# Patient Record
Sex: Male | Born: 1950 | Race: White | Hispanic: No | Marital: Married | Smoking: Never smoker
Health system: Southern US, Community
[De-identification: ages and names within clinical notes are randomized; demographics above are authoritative.]

## PROBLEM LIST (undated history)

## (undated) DIAGNOSIS — E669 Obesity, unspecified: Secondary | ICD-10-CM

## (undated) DIAGNOSIS — I119 Hypertensive heart disease without heart failure: Secondary | ICD-10-CM

## (undated) DIAGNOSIS — E118 Type 2 diabetes mellitus with unspecified complications: Secondary | ICD-10-CM

## (undated) DIAGNOSIS — E66811 Obesity, class 1: Secondary | ICD-10-CM

## (undated) DIAGNOSIS — M1990 Unspecified osteoarthritis, unspecified site: Secondary | ICD-10-CM

## (undated) DIAGNOSIS — E785 Hyperlipidemia, unspecified: Secondary | ICD-10-CM

## (undated) DIAGNOSIS — I4891 Unspecified atrial fibrillation: Secondary | ICD-10-CM

## (undated) DIAGNOSIS — I9789 Other postprocedural complications and disorders of the circulatory system, not elsewhere classified: Secondary | ICD-10-CM

## (undated) DIAGNOSIS — I2510 Atherosclerotic heart disease of native coronary artery without angina pectoris: Secondary | ICD-10-CM

## (undated) DIAGNOSIS — I77810 Thoracic aortic ectasia: Secondary | ICD-10-CM

## (undated) HISTORY — DX: Type 2 diabetes mellitus with unspecified complications: E11.8

## (undated) HISTORY — PX: NASAL SEPTUM SURGERY: SHX37

## (undated) HISTORY — DX: Unspecified atrial fibrillation: I48.91

## (undated) HISTORY — DX: Hypertensive heart disease without heart failure: I11.9

## (undated) HISTORY — PX: SCROTUM EXPLORATION: SHX2389

## (undated) HISTORY — DX: Other postprocedural complications and disorders of the circulatory system, not elsewhere classified: I97.89

## (undated) HISTORY — DX: Hyperlipidemia, unspecified: E78.5

---

## 2013-02-25 ENCOUNTER — Ambulatory Visit (INDEPENDENT_AMBULATORY_CARE_PROVIDER_SITE_OTHER): Payer: BC Managed Care – PPO | Admitting: Internal Medicine

## 2013-02-25 ENCOUNTER — Encounter: Payer: Self-pay | Admitting: Internal Medicine

## 2013-02-25 VITALS — BP 112/80 | HR 79 | Ht 75.0 in | Wt 266.7 lb

## 2013-02-25 DIAGNOSIS — E669 Obesity, unspecified: Secondary | ICD-10-CM

## 2013-02-25 DIAGNOSIS — I10 Essential (primary) hypertension: Secondary | ICD-10-CM

## 2013-02-25 DIAGNOSIS — E119 Type 2 diabetes mellitus without complications: Secondary | ICD-10-CM

## 2013-02-25 DIAGNOSIS — I25119 Atherosclerotic heart disease of native coronary artery with unspecified angina pectoris: Secondary | ICD-10-CM | POA: Insufficient documentation

## 2013-02-25 DIAGNOSIS — I2510 Atherosclerotic heart disease of native coronary artery without angina pectoris: Secondary | ICD-10-CM

## 2013-02-25 DIAGNOSIS — E785 Hyperlipidemia, unspecified: Secondary | ICD-10-CM | POA: Insufficient documentation

## 2013-02-25 NOTE — Progress Notes (Signed)
OFFICE NOTE  Chief Complaint:  Establish new cardiologist  Primary Care Physician: Jolene Provost, MD  HPI:  Frank Moreno. is a pleasant 63 year old male who was recently followed [facility]a [facility] [place]. 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  Mr. Frank Moreno previously worked for the [employer] and is now retired. 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  He does have a history of coronary artery disease and was found to have a 50% mid circumflex stenosis by cath and 1999. At that time he was on no medical therapy for coronary disease and subsequently was placed on a statin, beta blocker and ARB and aspirin. He since done well and had no coronary events over the past 16 years. 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 He reports his cholesterol as been well controlled and that his blood pressure is usually within normal limits. He is active but does not regularly exercise. He reports sleeping well at night denies any snoring, gasping or other obstructive sleep apnea symptoms.  He did have a nuclear stress test in 2013 which was negative for ischemia. His last lipid profile was in December 2013 which showed total cholesterol 126, triglycerides 239, HDL 33 and LDL 45.  EF was 61% by nuclear stress testing in June 2013, which was nonischemic.  PMHx:  Past Medical History  Diagnosis Date  . Diabetes mellitus without complication   . Hyperlipidemia   . Coronary artery disease   . Hypertension     Past Surgical History  Procedure Laterality Date  . Cardiac catheterization      FAMHx:  Family History  Problem Relation Age of Onset  . Heart disease Mother   . Heart disease Maternal Grandfather     SOCHx:   reports that he has never smoked. He has never used smokeless tobacco. He reports that he does not drink alcohol or use illicit drugs.  ALLERGIES:  Allergies  Allergen  Reactions  . Ceclor [Cefaclor] Rash  . Penicillins Rash    ROS: A comprehensive review of systems was negative.  HOME MEDS: Current Outpatient Prescriptions  Medication Sig Dispense Refill  . albuterol (PROVENTIL HFA;VENTOLIN HFA) 108 (90 BASE) MCG/ACT inhaler Inhale 1-2 puffs into the lungs every 6 (six) hours as needed for wheezing or shortness of breath.      . allopurinol (ZYLOPRIM) 300 MG tablet Take 300 mg by mouth daily.      Marland Kitchen aspirin 81 MG tablet Take 81 mg by mouth daily.      . Canagliflozin-Metformin HCl (INVOKAMET) 50-1000 MG TABS Take 1 tablet by mouth 2 (two) times daily.      . carvedilol (COREG) 3.125 MG tablet Take 3.125 mg by mouth 2 (two) times daily with a meal.      . hydrochlorothiazide (HYDRODIURIL) 25 MG tablet Take 25 mg by mouth daily.      Marland Kitchen HYDROcodone-acetaminophen (NORCO/VICODIN) 5-325 MG per tablet Take 1 tablet by mouth every 6 (six) hours as needed for moderate pain.      Marland Kitchen losartan (COZAAR) 50 MG tablet Take 50 mg by mouth daily.      . nitroGLYCERIN (NITROSTAT) 0.4 MG SL tablet Place 0.4 mg under the tongue every 5 (five) minutes as needed for chest pain.      . simvastatin (ZOCOR) 80 MG tablet Take 80 mg by mouth daily.      Marland Kitchen  testosterone (ANDROGEL) 50 MG/5GM GEL Place 10 g onto the skin daily.       No current facility-administered medications for this visit.    LABS/IMAGING: No results found for this or any previous visit (from the past 48 hour(s)). No results found.  VITALS: BP 112/80  Pulse 79  Ht 6\' 3"  (1.905 m)  Wt 266 lb 11.2 oz (120.974 kg)  BMI 33.34 kg/m2  EXAM: General appearance: alert and no distress Neck: no carotid bruit and no JVD Lungs: clear to auscultation bilaterally Heart: regular rate and rhythm, S1, S2 normal, no murmur, click, rub or gallop Abdomen: soft, non-tender; bowel sounds normal; no masses,  no organomegaly Extremities: extremities normal, atraumatic, no cyanosis or edema Pulses: 2+ and symmetric Skin:  Skin color, texture, turgor normal. No rashes or lesions Neurologic: Grossly normal Psych: Mood, affect normal  EKG: Sinus rhythm at 79  ASSESSMENT: 1. Mild to moderate, nonobstructive coronary disease by cath in 1999 2. Hypertension-controlled 3. Dyslipidemia 4. Diabetes type 2 5. Obesity  PLAN: 1.   [name] has done well without any cardiovascular events over the past 16 years. He seems to be adequately controlled with his current medicine regimen. His triglycerides were high by testing in 2013. For now I would continue his current medications and plan to recheck lipid NMR in 6 months prior to his return. He may ultimately need a fibrate.  I've again highly recommended exercise and increase physical activity which can help with weight loss and overall risk factor modification. No changes to his current medications.  Thank you for allowing me to participate in his care.  Chrystie NoseKenneth C. Mikylah Ackroyd, MD, [facility] Attending Cardiologist [facility]  Ashna Dorough C 02/25/2013, 1:24 PM

## 2013-02-25 NOTE — Patient Instructions (Signed)
Your physician recommends that you return for lab work in: 6 months, prior to your office visit.   Your physician wants you to follow-up in: 6 months with Dr. Rennis GoldenHilty. You will receive a reminder letter in the mail two months in advance. If you don't receive a letter, please call our office to schedule the follow-up appointment.

## 2013-03-06 ENCOUNTER — Encounter: Payer: Self-pay | Admitting: Internal Medicine

## 2013-06-27 DIAGNOSIS — I77810 Thoracic aortic ectasia: Secondary | ICD-10-CM | POA: Diagnosis present

## 2013-07-22 ENCOUNTER — Telehealth: Payer: Self-pay | Admitting: Internal Medicine

## 2013-07-22 NOTE — Telephone Encounter (Signed)
Please mail out a lab order to patient before his appt on 08/29/13 .Marland Kitchen. Thanks

## 2013-07-22 NOTE — Telephone Encounter (Signed)
NMR lab order printed from last OV and lab slip mailed to patient. Called wife to tell her about this and that the labs were to be fasting.

## 2013-08-10 HISTORY — PX: CARDIAC CATHETERIZATION: SHX172

## 2013-08-21 LAB — NMR LIPOPROFILE WITH LIPIDS
Cholesterol, Total: 114 mg/dL (ref <200)
HDL Particle Number: 29.1 umol/L — ABNORMAL LOW (ref >=30.5)
HDL Size: 8.1 nm — ABNORMAL LOW (ref >=9.2)
HDL-C: 32 mg/dL — ABNORMAL LOW (ref >=40)
LARGE VLDL-P: 9.5 nmol/L — AB (ref <=2.7)
LDL (calc): 45 mg/dL (ref <100)
LDL PARTICLE NUMBER: 1284 nmol/L — AB (ref <1000)
LDL SIZE: 19.7 nm — AB (ref >20.5)
LP-IR Score: 86 — ABNORMAL HIGH (ref <=45)
Large HDL-P: <1.3 umol/L — ABNORMAL LOW (ref >=4.8)
SMALL LDL PARTICLE NUMBER: 1127 nmol/L — AB (ref <=527)
Triglycerides: 183 mg/dL — ABNORMAL HIGH (ref <150)
VLDL SIZE: 56.3 nm — AB (ref <=46.6)

## 2013-08-29 ENCOUNTER — Ambulatory Visit (INDEPENDENT_AMBULATORY_CARE_PROVIDER_SITE_OTHER): Payer: BC Managed Care – PPO | Admitting: Internal Medicine

## 2013-08-29 ENCOUNTER — Encounter: Payer: Self-pay | Admitting: Internal Medicine

## 2013-08-29 VITALS — BP 102/70 | HR 70 | Ht 75.0 in | Wt 267.0 lb

## 2013-08-29 DIAGNOSIS — E119 Type 2 diabetes mellitus without complications: Secondary | ICD-10-CM

## 2013-08-29 DIAGNOSIS — D689 Coagulation defect, unspecified: Secondary | ICD-10-CM

## 2013-08-29 DIAGNOSIS — I200 Unstable angina: Secondary | ICD-10-CM

## 2013-08-29 DIAGNOSIS — I2510 Atherosclerotic heart disease of native coronary artery without angina pectoris: Secondary | ICD-10-CM

## 2013-08-29 DIAGNOSIS — E785 Hyperlipidemia, unspecified: Secondary | ICD-10-CM

## 2013-08-29 DIAGNOSIS — I10 Essential (primary) hypertension: Secondary | ICD-10-CM

## 2013-08-29 DIAGNOSIS — R5383 Other fatigue: Secondary | ICD-10-CM

## 2013-08-29 DIAGNOSIS — E669 Obesity, unspecified: Secondary | ICD-10-CM

## 2013-08-29 DIAGNOSIS — I25119 Atherosclerotic heart disease of native coronary artery with unspecified angina pectoris: Secondary | ICD-10-CM

## 2013-08-29 DIAGNOSIS — I209 Angina pectoris, unspecified: Secondary | ICD-10-CM

## 2013-08-29 DIAGNOSIS — Z01818 Encounter for other preprocedural examination: Secondary | ICD-10-CM

## 2013-08-29 DIAGNOSIS — R5381 Other malaise: Secondary | ICD-10-CM

## 2013-08-29 DIAGNOSIS — R079 Chest pain, unspecified: Secondary | ICD-10-CM

## 2013-08-29 NOTE — Patient Instructions (Addendum)
Your physician has [facility] that [facility]. Cardiac catheterization is used to diagnose and/or treat various heart conditions. Doctors may recommend this procedure for a number of different reasons. The most common reason is to evaluate chest pain. Chest pain can be a symptom of coronary artery disease (CAD), and cardiac catheterization can show whether plaque is narrowing or blocking your heart's arteries. This procedure is also used to evaluate the valves, as well as measure the blood flow and oxygen levels in different parts of your heart. For further information please visit [email or web address] Please follow instruction sheet, as given.  You will need to have blood work & a chest x-ray 3-5 days prior to this procedure.  [facility] [address] You do not need an appointment  ** please schedule this with Dr. Rennis GoldenHilty for the first week of September (radial access)  Your physician has recommended you make the following change in your medication: STOP LOSARTAN

## 2013-08-29 NOTE — Progress Notes (Signed)
Patient ID: Rodell PernaHenry Dann Jr., male   DOB: 1950/10/13, 63 y.o.   MRN: [id number]    OFFICE NOTE  Chief Complaint:  6 month follow-up  Primary Care Physician: Jolene ProvostHAIMES,DAVID M, MD  HPI:  Rodell PernaHenry Cooler Jr. is a pleasant 63 year old male who was recently followed by Dr. Luberta Robertsonrowell [facility] [place]. 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 [name] previously worked for the [employer] and is now retired. He is an avid Therapist, nutritionalhunter and fisherman as well as has a gun hobby. He does have a history of coronary artery disease and was found to have a 50% mid circumflex stenosis by cath and 1999. At that time he was on no medical therapy for coronary disease and subsequently was placed on a statin, beta blocker and ARB and aspirin.   He notes he was hospitalized in May after a tooth extraction at his regular dentist. Following this he became septic and was in the ICU on antibiotics. Was in the hospital for 4 days. Found AAA while in the hospital. Notes dyspnea, diaphoresis, and no energy since leaving hospital. This occurs intermittently. His last episode of this occurred Tuesday and had episode of sharp chest pain when pick up a watermelon. Pain is in the middle of his chest and occurs in the middle of his back. Like being stabbed with an ice pick and sometimes it is a tight feeling. Sometimes moves to arms and rarely in his back. Has had dyspnea walking to the mailbox, about 100 feet of walking. This occurs intermittently. Chest pain comes with moderate activity such as weed eating. Can also occur at rest. The only thing that relieves the pain is resting. Has previously had shortness of breath and chest pain though this has worsened since his hospitalization. Denies syncope and palpitations. No orthopnea or PND. He does note he has taken nitroglycerin in the past for his chest pain with some benefit. He notes he infrequently takes cialis and was not aware that he should not mix the  cialis and nitroglycerin.  Has seen CT surgery for AAA since discharge from the hospital. Told he would not have surgery due to the size being less than 5.5 cm. Notes no abdominal pain. Does note cramps in feet and legs at night.  Notes he was referred to cardiology to see Dr Leeann Mustenaldo. Had a stress test in [facility] that revealed no infarct or ischemia, EF of 64%, and normal wall motion. Doubled his coreg at that visit. Decreased his HCTZ to 12.5 mg.  PMHx:  Past Medical History  Diagnosis Date  . Diabetes mellitus without complication   . Hyperlipidemia   . Coronary artery disease   . Hypertension     Past Surgical History  Procedure Laterality Date  . Cardiac catheterization      FAMHx:  Family History  Problem Relation Age of Onset  . Heart disease Mother   . Heart disease Maternal Grandfather     SOCHx:   reports that he has never smoked. He has never used smokeless tobacco. He reports that he does not drink alcohol or use illicit drugs.  ALLERGIES:  Allergies  Allergen Reactions  . Ceclor [Cefaclor] Rash  . Penicillins Rash    ROS: A comprehensive review of systems was negative except for: Respiratory: positive for dyspnea on exertion Cardiovascular: positive for chest pain, chest pressure/discomfort, dyspnea, exertional chest pressure/discomfort and fatigue Cramps in feet.   HOME MEDS: Current Outpatient Prescriptions  Medication Sig Dispense  Refill  . acetaminophen-codeine (TYLENOL #4) 300-60 MG per tablet as needed.      Marland Kitchen albuterol (PROVENTIL HFA;VENTOLIN HFA) 108 (90 BASE) MCG/ACT inhaler Inhale 1-2 puffs into the lungs every 6 (six) hours as needed for wheezing or shortness of breath.      . allopurinol (ZYLOPRIM) 300 MG tablet Take 300 mg by mouth daily.      Marland Kitchen aspirin 81 MG tablet Take 81 mg by mouth daily.      . Canagliflozin-Metformin HCl (INVOKAMET) 50-1000 MG TABS Take 1 tablet by mouth 2 (two) times daily.      . carvedilol (COREG) 3.125 MG  tablet Take 3.125 mg by mouth 2 (two) times daily with a meal.      . Cholecalciferol (VITAMIN D-3) 1000 UNITS CAPS Take 4,000 Units by mouth daily.      . hydrochlorothiazide (HYDRODIURIL) 25 MG tablet Take 12.5 mg by mouth daily.       Marland Kitchen HYDROcodone-acetaminophen (NORCO/VICODIN) 5-325 MG per tablet Take 1 tablet by mouth every 6 (six) hours as needed for moderate pain.      Marland Kitchen losartan (COZAAR) 50 MG tablet Take 50 mg by mouth daily.      . nitroGLYCERIN (NITROSTAT) 0.4 MG SL tablet Place 0.4 mg under the tongue every 5 (five) minutes as needed for chest pain.      . piroxicam (FELDENE) 20 MG capsule Take 20 mg by mouth daily.      Marland Kitchen pyridoxine (B-6) 100 MG tablet Take 200 mg by mouth daily.      . sildenafil (VIAGRA) 50 MG tablet Take 50 mg by mouth as needed.      . simvastatin (ZOCOR) 80 MG tablet Take 80 mg by mouth daily.      Marland Kitchen testosterone (ANDROGEL) 50 MG/5GM GEL Place 10 g onto the skin daily.      Marland Kitchen tiZANidine (ZANAFLEX) 4 MG tablet Take 4 mg by mouth 2 (two) times daily as needed.      . vitamin C (ASCORBIC ACID) 250 MG tablet Take 500 mg by mouth daily.       No current facility-administered medications for this visit.    LABS/IMAGING: No results found for this or any previous visit (from the past 48 hour(s)). No results found.  VITALS: BP 102/70  Pulse 70  Ht 6\' 3"  (1.905 m)  Wt 267 lb (121.11 kg)  BMI 33.37 kg/m2  EXAM: General appearance: alert, cooperative and no distress Neck: no adenopathy, no carotid bruit, no JVD and supple, symmetrical, trachea midline Lungs: clear to auscultation bilaterally Heart: regular rate and rhythm, S1, S2 normal, no murmur, click, rub or gallop Abdomen: soft, non-tender; bowel sounds normal; no masses,  no organomegaly Extremities: extremities normal, atraumatic, no cyanosis or edema Pulses: 2+ and symmetric radial Skin: Skin color, texture, turgor normal. No rashes or lesions  EKG: NSR, rate 70, no ischemic  changes  ASSESSMENT: 1. Anginal chest pain - recent negative stress test 2. Mild to moderate, non-obstructive coronary disease by cath in 1999 3. Hypertension-controlled 4. Dyslipidemia 5. Type 2 Diabetes 6. Obesity 7. Abdominal aortic aneurysm  PLAN: 1.   Patient presents with typical anginal pain in spite of recent normal stress test. The persistence of symptoms brings up the possibility of a false negative stress test. We discussed this with the patient. We additionally discussed that his recent illness could have resulted in changes that pushed his previous cardiac disease to the point of causing chest pain. Given this continued  chest pain we will proceed with cardiac catheterization to further characterize his coronary arteries. His abdominal aortic aneurysm is at a size that should be monitored every 6-12 months for progression. At this time we will focus on risk factor management for this issue. His blood pressure is on the low end of normal and we will discontinue his losartan to see if this brings his blood pressure in to a more appropriate range. We discussed return precautions. He will be scheduled for catheterization the week after next.   Marikay Alar 08/29/2013, 11:31 AM  Pt. Seen and examined. Agree with the resident note as written.  Mr. Poblete has unfortunately been very sick recently. Seems to all started after he had tooth extraction and developed sepsis. This is quite unusual. Nevertheless after this episode is felt fatigued he's had recurrent episodes of chest discomfort and tightness when he performs exercises which is relieved by rest. When he exerts himself he has had episodes where he becomes nauseated and sweaty. He takes nitroglycerin and had relief in his symptoms. He was referred to a cardiologist with [facility] and underwent a nuclear stress test which was negative for ischemia. He had medication adjustments including increasing in of his beta blocker to help  apparently with this aneurysm that was discovered. He also saw thoracic surgeon who felt that he was not at this point necessarily in need of any surgical repair of his aneurysm. I would agree with that. He will need close followup with ultrasound and we will do that in this office. I am concerned about his ongoing anginal symptoms. They do sound fairly typical and he does have a history of coronary disease remotely which was at least moderate in 1999. He has not had catheterization or a relook since that time. I am concerned that his stress test may represent a false negative and he could have balanced ischemia. Based on his ongoing symptoms I would recommend cardiac catheterization. He is agreeable to this. We'll likely plan a radial approach due to his abdominal aortic aneurysm. Hopefully we can compasses within the next few weeks.  Chrystie Nose, MD, [facility] Attending Cardiologist [facility]

## 2013-08-30 ENCOUNTER — Encounter: Payer: Self-pay | Admitting: Internal Medicine

## 2013-08-30 DIAGNOSIS — R5383 Other fatigue: Secondary | ICD-10-CM | POA: Insufficient documentation

## 2013-08-30 DIAGNOSIS — I200 Unstable angina: Secondary | ICD-10-CM | POA: Insufficient documentation

## 2013-09-03 ENCOUNTER — Encounter (HOSPITAL_COMMUNITY): Payer: Self-pay

## 2013-09-05 ENCOUNTER — Ambulatory Visit
Admission: RE | Admit: 2013-09-05 | Discharge: 2013-09-05 | Disposition: A | Discharge status: Home / Self Care | Payer: BC Managed Care – PPO | Source: Ambulatory Visit | Attending: Internal Medicine | Admitting: Internal Medicine

## 2013-09-05 DIAGNOSIS — Z01818 Encounter for other preprocedural examination: Secondary | ICD-10-CM

## 2013-09-05 LAB — CBC
HCT: 51.9 % (ref 39.0–52.0)
Hemoglobin: 17.9 g/dL — ABNORMAL HIGH (ref 13.0–17.0)
MCH: 30 pg (ref 26.0–34.0)
MCHC: 34.5 g/dL (ref 30.0–36.0)
MCV: 86.9 fL (ref 78.0–100.0)
PLATELETS: 191 10*3/uL (ref 150–400)
RBC: 5.97 MIL/uL — ABNORMAL HIGH (ref 4.22–5.81)
RDW: 14.8 % (ref 11.5–15.5)
WBC: 7.1 10*3/uL (ref 4.0–10.5)

## 2013-09-05 LAB — BASIC METABOLIC PANEL
BUN: 20 mg/dL (ref 6–23)
CO2: 26 meq/L (ref 19–32)
Calcium: 9.6 mg/dL (ref 8.4–10.5)
Chloride: 102 mEq/L (ref 96–112)
Creat: 1.23 mg/dL (ref 0.50–1.35)
Glucose, Bld: 119 mg/dL — ABNORMAL HIGH (ref 70–99)
Potassium: 4.6 mEq/L (ref 3.5–5.3)
Sodium: 136 mEq/L (ref 135–145)

## 2013-09-05 LAB — TSH: TSH: 3.031 u[IU]/mL (ref 0.350–4.500)

## 2013-09-06 LAB — PROTIME-INR
INR: 0.93 (ref <1.50)
Prothrombin Time: 12.5 seconds (ref 11.6–15.2)

## 2013-09-06 LAB — APTT: aPTT: 33 seconds (ref 24–37)

## 2013-09-11 ENCOUNTER — Other Ambulatory Visit: Payer: Self-pay

## 2013-09-11 ENCOUNTER — Encounter (HOSPITAL_COMMUNITY)
Admission: RE | Disposition: A | Discharge status: Home / Self Care | Payer: Self-pay | Source: Ambulatory Visit | Attending: Thoracic Surgery (Cardiothoracic Vascular Surgery)

## 2013-09-11 ENCOUNTER — Inpatient Hospital Stay (HOSPITAL_COMMUNITY): Payer: BC Managed Care – PPO

## 2013-09-11 ENCOUNTER — Encounter (HOSPITAL_COMMUNITY): Payer: Self-pay | Admitting: Thoracic Surgery (Cardiothoracic Vascular Surgery)

## 2013-09-11 ENCOUNTER — Inpatient Hospital Stay (HOSPITAL_COMMUNITY)
Admission: RE | Admit: 2013-09-11 | Discharge: 2013-09-20 | DRG: 233 | Disposition: A | Discharge status: Home / Self Care | Payer: BC Managed Care – PPO | Source: Ambulatory Visit | Attending: Thoracic Surgery (Cardiothoracic Vascular Surgery) | Admitting: Thoracic Surgery (Cardiothoracic Vascular Surgery)

## 2013-09-11 DIAGNOSIS — D62 Acute posthemorrhagic anemia: Secondary | ICD-10-CM | POA: Diagnosis not present

## 2013-09-11 DIAGNOSIS — I7121 Aneurysm of the ascending aorta, without rupture: Secondary | ICD-10-CM | POA: Diagnosis present

## 2013-09-11 DIAGNOSIS — Y9210 Unspecified residential institution as the place of occurrence of the external cause: Secondary | ICD-10-CM | POA: Diagnosis not present

## 2013-09-11 DIAGNOSIS — E8779 Other fluid overload: Secondary | ICD-10-CM

## 2013-09-11 DIAGNOSIS — I712 Thoracic aortic aneurysm, without rupture, unspecified: Secondary | ICD-10-CM | POA: Diagnosis present

## 2013-09-11 DIAGNOSIS — I10 Essential (primary) hypertension: Secondary | ICD-10-CM

## 2013-09-11 DIAGNOSIS — J029 Acute pharyngitis, unspecified: Secondary | ICD-10-CM | POA: Diagnosis not present

## 2013-09-11 DIAGNOSIS — J90 Pleural effusion, not elsewhere classified: Secondary | ICD-10-CM

## 2013-09-11 DIAGNOSIS — I739 Peripheral vascular disease, unspecified: Secondary | ICD-10-CM | POA: Diagnosis present

## 2013-09-11 DIAGNOSIS — I25119 Atherosclerotic heart disease of native coronary artery with unspecified angina pectoris: Secondary | ICD-10-CM | POA: Diagnosis present

## 2013-09-11 DIAGNOSIS — J9819 Other pulmonary collapse: Secondary | ICD-10-CM | POA: Diagnosis not present

## 2013-09-11 DIAGNOSIS — E785 Hyperlipidemia, unspecified: Secondary | ICD-10-CM

## 2013-09-11 DIAGNOSIS — Z6834 Body mass index (BMI) 34.0-34.9, adult: Secondary | ICD-10-CM

## 2013-09-11 DIAGNOSIS — Z01818 Encounter for other preprocedural examination: Secondary | ICD-10-CM

## 2013-09-11 DIAGNOSIS — I519 Heart disease, unspecified: Secondary | ICD-10-CM | POA: Diagnosis not present

## 2013-09-11 DIAGNOSIS — R5383 Other fatigue: Secondary | ICD-10-CM

## 2013-09-11 DIAGNOSIS — E66811 Obesity, class 1: Secondary | ICD-10-CM

## 2013-09-11 DIAGNOSIS — Y849 Medical procedure, unspecified as the cause of abnormal reaction of the patient, or of later complication, without mention of misadventure at the time of the procedure: Secondary | ICD-10-CM | POA: Diagnosis not present

## 2013-09-11 DIAGNOSIS — I4891 Unspecified atrial fibrillation: Secondary | ICD-10-CM | POA: Diagnosis not present

## 2013-09-11 DIAGNOSIS — I509 Heart failure, unspecified: Secondary | ICD-10-CM | POA: Diagnosis present

## 2013-09-11 DIAGNOSIS — I25720 Atherosclerosis of autologous artery coronary artery bypass graft(s) with unstable angina pectoris: Secondary | ICD-10-CM

## 2013-09-11 DIAGNOSIS — M1990 Unspecified osteoarthritis, unspecified site: Secondary | ICD-10-CM | POA: Insufficient documentation

## 2013-09-11 DIAGNOSIS — I200 Unstable angina: Secondary | ICD-10-CM

## 2013-09-11 DIAGNOSIS — E669 Obesity, unspecified: Secondary | ICD-10-CM | POA: Diagnosis present

## 2013-09-11 DIAGNOSIS — I208 Other forms of angina pectoris: Secondary | ICD-10-CM | POA: Diagnosis present

## 2013-09-11 DIAGNOSIS — Z951 Presence of aortocoronary bypass graft: Secondary | ICD-10-CM

## 2013-09-11 DIAGNOSIS — I2510 Atherosclerotic heart disease of native coronary artery without angina pectoris: Secondary | ICD-10-CM

## 2013-09-11 DIAGNOSIS — G4733 Obstructive sleep apnea (adult) (pediatric): Secondary | ICD-10-CM | POA: Diagnosis present

## 2013-09-11 DIAGNOSIS — I77810 Thoracic aortic ectasia: Secondary | ICD-10-CM | POA: Diagnosis present

## 2013-09-11 DIAGNOSIS — I480 Paroxysmal atrial fibrillation: Secondary | ICD-10-CM

## 2013-09-11 DIAGNOSIS — E119 Type 2 diabetes mellitus without complications: Secondary | ICD-10-CM | POA: Diagnosis present

## 2013-09-11 DIAGNOSIS — Z6833 Body mass index (BMI) 33.0-33.9, adult: Secondary | ICD-10-CM

## 2013-09-11 DIAGNOSIS — I5031 Acute diastolic (congestive) heart failure: Secondary | ICD-10-CM | POA: Diagnosis not present

## 2013-09-11 DIAGNOSIS — R5381 Other malaise: Secondary | ICD-10-CM

## 2013-09-11 DIAGNOSIS — I2089 Other forms of angina pectoris: Secondary | ICD-10-CM | POA: Diagnosis present

## 2013-09-11 HISTORY — DX: Atherosclerotic heart disease of native coronary artery without angina pectoris: I25.10

## 2013-09-11 HISTORY — PX: LEFT HEART CATHETERIZATION WITH CORONARY ANGIOGRAM: SHX5451

## 2013-09-11 HISTORY — DX: Unspecified osteoarthritis, unspecified site: M19.90

## 2013-09-11 HISTORY — DX: Thoracic aortic ectasia: I77.810

## 2013-09-11 HISTORY — DX: Obesity, class 1: E66.811

## 2013-09-11 HISTORY — DX: Obesity, unspecified: E66.9

## 2013-09-11 LAB — COMPREHENSIVE METABOLIC PANEL
ALT: 36 U/L (ref 0–53)
ANION GAP: 10 (ref 5–15)
AST: 22 U/L (ref 0–37)
Albumin: 3.8 g/dL (ref 3.5–5.2)
Alkaline Phosphatase: 35 U/L — ABNORMAL LOW (ref 39–117)
BILIRUBIN TOTAL: 1.5 mg/dL — AB (ref 0.3–1.2)
BUN: 19 mg/dL (ref 6–23)
CHLORIDE: 102 meq/L (ref 96–112)
CO2: 29 meq/L (ref 19–32)
Calcium: 9.2 mg/dL (ref 8.4–10.5)
Creatinine, Ser: 1.09 mg/dL (ref 0.50–1.35)
GFR calc non Af Amer: 71 mL/min — ABNORMAL LOW (ref >90)
GFR, EST AFRICAN AMERICAN: 82 mL/min — AB (ref >90)
GLUCOSE: 151 mg/dL — AB (ref 70–99)
POTASSIUM: 4.4 meq/L (ref 3.7–5.3)
SODIUM: 141 meq/L (ref 137–147)
Total Protein: 6.7 g/dL (ref 6.0–8.3)

## 2013-09-11 LAB — CK TOTAL AND CKMB (NOT AT ARMC)
CK, MB: 1.3 ng/mL (ref 0.3–4.0)
Relative Index: INVALID (ref 0.0–2.5)
Total CK: 91 U/L (ref 7–232)

## 2013-09-11 LAB — PULMONARY FUNCTION TEST
DL/VA % pred: 97 %
DL/VA: 4.72 ml/min/mmHg/L
DLCO COR: 30.39 ml/min/mmHg
DLCO UNC % PRED: 87 %
DLCO cor % pred: 80 %
DLCO unc: 32.89 ml/min/mmHg
FEF 25-75 PRE: 3.67 L/s
FEF 25-75 Post: 5.88 L/sec
FEF2575-%Change-Post: 60 %
FEF2575-%Pred-Post: 181 %
FEF2575-%Pred-Pre: 112 %
FEV1-%CHANGE-POST: 11 %
FEV1-%Pred-Post: 98 %
FEV1-%Pred-Pre: 88 %
FEV1-PRE: 3.59 L
FEV1-Post: 4 L
FEV1FVC-%Change-Post: 0 %
FEV1FVC-%Pred-Pre: 109 %
FEV6-%Change-Post: 10 %
FEV6-%PRED-POST: 93 %
FEV6-%Pred-Pre: 84 %
FEV6-POST: 4.83 L
FEV6-Pre: 4.36 L
FEV6FVC-%PRED-POST: 104 %
FEV6FVC-%PRED-PRE: 104 %
FVC-%CHANGE-POST: 10 %
FVC-%PRED-PRE: 80 %
FVC-%Pred-Post: 89 %
FVC-POST: 4.83 L
FVC-Pre: 4.36 L
PRE FEV1/FVC RATIO: 82 %
Post FEV1/FVC ratio: 83 %
Post FEV6/FVC ratio: 100 %
Pre FEV6/FVC Ratio: 100 %
RV % pred: 89 %
RV: 2.23 L
TLC % PRED: 91 %
TLC: 7.11 L

## 2013-09-11 LAB — BASIC METABOLIC PANEL
Anion gap: 13 (ref 5–15)
BUN: 21 mg/dL (ref 6–23)
CALCIUM: 9.3 mg/dL (ref 8.4–10.5)
CO2: 24 meq/L (ref 19–32)
CREATININE: 1.21 mg/dL (ref 0.50–1.35)
Chloride: 101 mEq/L (ref 96–112)
GFR calc Af Amer: 72 mL/min — ABNORMAL LOW (ref >90)
GFR calc non Af Amer: 62 mL/min — ABNORMAL LOW (ref >90)
GLUCOSE: 120 mg/dL — AB (ref 70–99)
Potassium: 4.8 mEq/L (ref 3.7–5.3)
Sodium: 138 mEq/L (ref 137–147)

## 2013-09-11 LAB — BLOOD GAS, ARTERIAL
ACID-BASE EXCESS: 0.6 mmol/L (ref 0.0–2.0)
Bicarbonate: 24.3 mEq/L — ABNORMAL HIGH (ref 20.0–24.0)
Drawn by: 40662
FIO2: 0.21 %
O2 SAT: 96.6 %
PCO2 ART: 36.5 mmHg (ref 35.0–45.0)
PO2 ART: 85.2 mmHg (ref 80.0–100.0)
Patient temperature: 98.6
TCO2: 25.4 mmol/L (ref 0–100)
pH, Arterial: 7.439 (ref 7.350–7.450)

## 2013-09-11 LAB — GLUCOSE, CAPILLARY
GLUCOSE-CAPILLARY: 111 mg/dL — AB (ref 70–99)
Glucose-Capillary: 126 mg/dL — ABNORMAL HIGH (ref 70–99)

## 2013-09-11 LAB — PROTIME-INR
INR: 0.98 (ref 0.00–1.49)
Prothrombin Time: 13 seconds (ref 11.6–15.2)

## 2013-09-11 LAB — TYPE AND SCREEN
ABO/RH(D): A POS
Antibody Screen: NEGATIVE

## 2013-09-11 LAB — SURGICAL PCR SCREEN
MRSA, PCR: NEGATIVE
Staphylococcus aureus: NEGATIVE

## 2013-09-11 LAB — APTT: APTT: 31 s (ref 24–37)

## 2013-09-11 LAB — ABO/RH: ABO/RH(D): A POS

## 2013-09-11 LAB — PRO B NATRIURETIC PEPTIDE: Pro B Natriuretic peptide (BNP): 65 pg/mL (ref 0–125)

## 2013-09-11 SURGERY — LEFT HEART CATHETERIZATION WITH CORONARY ANGIOGRAM
Anesthesia: LOCAL

## 2013-09-11 MED ORDER — SODIUM CHLORIDE 0.9 % IV SOLN
INTRAVENOUS | Status: DC
  Filled 2013-09-11: qty 30

## 2013-09-11 MED ORDER — HYDROCODONE-ACETAMINOPHEN 5-325 MG PO TABS
1.0000 | ORAL_TABLET | Freq: Four times a day (QID) | ORAL | Status: DC | PRN
Start: 2013-09-11 — End: 2013-09-12

## 2013-09-11 MED ORDER — TIZANIDINE HCL 4 MG PO TABS
4.0000 mg | ORAL_TABLET | Freq: Two times a day (BID) | ORAL | Status: DC | PRN
  Filled 2013-09-11: qty 1

## 2013-09-11 MED ORDER — NITROGLYCERIN 0.4 MG SL SUBL: 0.4000 mg | SUBLINGUAL_TABLET | SUBLINGUAL | Status: DC | PRN

## 2013-09-11 MED ORDER — SODIUM CHLORIDE 0.9 % IV SOLN: INTRAVENOUS | Status: DC

## 2013-09-11 MED ORDER — MAGNESIUM SULFATE 50 % IJ SOLN
40.0000 meq | INTRAMUSCULAR | Status: DC
  Filled 2013-09-11: qty 10

## 2013-09-11 MED ORDER — ONDANSETRON HCL 4 MG/2ML IJ SOLN
4.0000 mg | Freq: Four times a day (QID) | INTRAMUSCULAR | Status: DC | PRN
  Administered 2013-09-12 – 2013-09-19 (×13): 4 mg via INTRAVENOUS
  Filled 2013-09-11 (×13): qty 2

## 2013-09-11 MED ORDER — CHLORHEXIDINE GLUCONATE 4 % EX LIQD
60.0000 mL | Freq: Once | CUTANEOUS | Status: AC
  Administered 2013-09-11: 4 via TOPICAL
  Filled 2013-09-11 (×2): qty 60

## 2013-09-11 MED ORDER — ALLOPURINOL 300 MG PO TABS: 300.0000 mg | ORAL_TABLET | Freq: Every day | ORAL | Status: DC

## 2013-09-11 MED ORDER — FENTANYL CITRATE 0.05 MG/ML IJ SOLN
INTRAMUSCULAR | Status: AC
  Filled 2013-09-11: qty 2

## 2013-09-11 MED ORDER — DOPAMINE-DEXTROSE 3.2-5 MG/ML-% IV SOLN
2.0000 ug/kg/min | INTRAVENOUS | Status: DC
  Filled 2013-09-11: qty 250

## 2013-09-11 MED ORDER — ASPIRIN 81 MG PO CHEW: 81.0000 mg | CHEWABLE_TABLET | ORAL | Status: DC

## 2013-09-11 MED ORDER — SODIUM CHLORIDE 0.9 % IV SOLN
INTRAVENOUS | Status: DC
  Administered 2013-09-11: 07:00:00 via INTRAVENOUS

## 2013-09-11 MED ORDER — LIDOCAINE HCL (PF) 1 % IJ SOLN
INTRAMUSCULAR | Status: AC
  Filled 2013-09-11: qty 30

## 2013-09-11 MED ORDER — AMINOCAPROIC ACID 250 MG/ML IV SOLN
INTRAVENOUS | Status: AC
  Administered 2013-09-12: 69.8 mL/h via INTRAVENOUS
  Filled 2013-09-11: qty 40

## 2013-09-11 MED ORDER — ALBUTEROL SULFATE (2.5 MG/3ML) 0.083% IN NEBU: 2.5000 mg | INHALATION_SOLUTION | Freq: Four times a day (QID) | RESPIRATORY_TRACT | Status: DC | PRN

## 2013-09-11 MED ORDER — DEXMEDETOMIDINE HCL IN NACL 400 MCG/100ML IV SOLN
0.1000 ug/kg/h | INTRAVENOUS | Status: AC
  Administered 2013-09-12: 0.2 ug/kg/h via INTRAVENOUS
  Administered 2013-09-12: 15:00:00 via INTRAVENOUS
  Filled 2013-09-11: qty 100

## 2013-09-11 MED ORDER — TEMAZEPAM 15 MG PO CAPS: 15.0000 mg | ORAL_CAPSULE | Freq: Once | ORAL | Status: AC | PRN

## 2013-09-11 MED ORDER — SODIUM CHLORIDE 0.9 % IV SOLN: 1.0000 mL/kg/h | INTRAVENOUS | Status: AC

## 2013-09-11 MED ORDER — NITROGLYCERIN 1 MG/10 ML FOR IR/CATH LAB
INTRA_ARTERIAL | Status: AC
  Filled 2013-09-11: qty 10

## 2013-09-11 MED ORDER — ALBUTEROL SULFATE HFA 108 (90 BASE) MCG/ACT IN AERS: 1.0000 | INHALATION_SPRAY | Freq: Four times a day (QID) | RESPIRATORY_TRACT | Status: DC | PRN

## 2013-09-11 MED ORDER — SODIUM CHLORIDE 0.9 % IJ SOLN: 3.0000 mL | INTRAMUSCULAR | Status: DC | PRN

## 2013-09-11 MED ORDER — VITAMIN D3 25 MCG (1000 UNIT) PO TABS: 4000.0000 [IU] | ORAL_TABLET | Freq: Every day | ORAL | Status: DC

## 2013-09-11 MED ORDER — VERAPAMIL HCL 2.5 MG/ML IV SOLN
INTRAVENOUS | Status: AC
  Filled 2013-09-11: qty 2

## 2013-09-11 MED ORDER — HEPARIN (PORCINE) IN NACL 2-0.9 UNIT/ML-% IJ SOLN
INTRAMUSCULAR | Status: AC
  Filled 2013-09-11: qty 1500

## 2013-09-11 MED ORDER — METOPROLOL TARTRATE 12.5 MG HALF TABLET
12.5000 mg | ORAL_TABLET | Freq: Once | ORAL | Status: AC
  Administered 2013-09-12: 12.5 mg via ORAL
  Filled 2013-09-11: qty 1

## 2013-09-11 MED ORDER — ASPIRIN 81 MG PO CHEW: 81.0000 mg | CHEWABLE_TABLET | Freq: Every day | ORAL | Status: DC

## 2013-09-11 MED ORDER — NITROGLYCERIN IN D5W 200-5 MCG/ML-% IV SOLN
2.0000 ug/min | INTRAVENOUS | Status: AC
  Administered 2013-09-12: 5 ug/min via INTRAVENOUS
  Filled 2013-09-11: qty 250

## 2013-09-11 MED ORDER — ACETAMINOPHEN 325 MG PO TABS: 650.0000 mg | ORAL_TABLET | ORAL | Status: DC | PRN

## 2013-09-11 MED ORDER — ALBUTEROL SULFATE (2.5 MG/3ML) 0.083% IN NEBU
2.5000 mg | INHALATION_SOLUTION | Freq: Once | RESPIRATORY_TRACT | Status: AC
  Administered 2013-09-11: 2.5 mg via RESPIRATORY_TRACT

## 2013-09-11 MED ORDER — POTASSIUM CHLORIDE 2 MEQ/ML IV SOLN
80.0000 meq | INTRAVENOUS | Status: DC
Start: 2013-09-12 — End: 2013-09-12
  Filled 2013-09-11: qty 40

## 2013-09-11 MED ORDER — SODIUM CHLORIDE 0.9 % IV SOLN
INTRAVENOUS | Status: AC
  Administered 2013-09-12: 1 [IU]/h via INTRAVENOUS
  Filled 2013-09-11: qty 2.5

## 2013-09-11 MED ORDER — VITAMIN C 500 MG PO TABS: 500.0000 mg | ORAL_TABLET | Freq: Every day | ORAL | Status: DC

## 2013-09-11 MED ORDER — ASPIRIN 81 MG PO TABS
81.0000 mg | ORAL_TABLET | Freq: Every day | ORAL | Status: DC
Start: 2013-09-11 — End: 2013-09-11

## 2013-09-11 MED ORDER — MIDAZOLAM HCL 2 MG/2ML IJ SOLN
INTRAMUSCULAR | Status: AC
  Filled 2013-09-11: qty 2

## 2013-09-11 MED ORDER — LEVOFLOXACIN IN D5W 500 MG/100ML IV SOLN
500.0000 mg | INTRAVENOUS | Status: AC
  Administered 2013-09-12: 500 mg via INTRAVENOUS
  Filled 2013-09-11: qty 100

## 2013-09-11 MED ORDER — IOHEXOL 350 MG/ML SOLN
100.0000 mL | Freq: Once | INTRAVENOUS | Status: AC | PRN
  Administered 2013-09-11: 100 mL via INTRAVENOUS

## 2013-09-11 MED ORDER — HYDROCHLOROTHIAZIDE 25 MG PO TABS: 12.5000 mg | ORAL_TABLET | Freq: Every day | ORAL | Status: DC

## 2013-09-11 MED ORDER — PLASMA-LYTE 148 IV SOLN
INTRAVENOUS | Status: AC
  Administered 2013-09-12: 09:00:00
  Filled 2013-09-11: qty 2.5

## 2013-09-11 MED ORDER — ATORVASTATIN CALCIUM 40 MG PO TABS
40.0000 mg | ORAL_TABLET | Freq: Every day | ORAL | Status: DC
  Filled 2013-09-11: qty 1

## 2013-09-11 MED ORDER — VITAMIN B-6 100 MG PO TABS: 200.0000 mg | ORAL_TABLET | Freq: Every day | ORAL | Status: DC

## 2013-09-11 MED ORDER — TESTOSTERONE 50 MG/5GM (1%) TD GEL: 10.0000 g | Freq: Every day | TRANSDERMAL | Status: DC

## 2013-09-11 MED ORDER — BISACODYL 5 MG PO TBEC
5.0000 mg | DELAYED_RELEASE_TABLET | Freq: Once | ORAL | Status: AC
  Administered 2013-09-11: 5 mg via ORAL
  Filled 2013-09-11: qty 1

## 2013-09-11 MED ORDER — CHLORHEXIDINE GLUCONATE 4 % EX LIQD
60.0000 mL | Freq: Once | CUTANEOUS | Status: AC
  Administered 2013-09-12: 4 via TOPICAL
  Filled 2013-09-11 (×2): qty 60

## 2013-09-11 MED ORDER — VANCOMYCIN HCL 10 G IV SOLR
1500.0000 mg | INTRAVENOUS | Status: AC
  Administered 2013-09-12: 1500 mg via INTRAVENOUS
  Filled 2013-09-11 (×2): qty 1500

## 2013-09-11 MED ORDER — VANCOMYCIN HCL 1000 MG IV SOLR
INTRAVENOUS | Status: AC
  Administered 2013-09-12: 12:00:00
  Filled 2013-09-11: qty 1000

## 2013-09-11 MED ORDER — HEPARIN SODIUM (PORCINE) 1000 UNIT/ML IJ SOLN
INTRAMUSCULAR | Status: AC
  Filled 2013-09-11: qty 1

## 2013-09-11 MED ORDER — DEXTROSE 5 % IV SOLN
0.5000 ug/min | INTRAVENOUS | Status: DC
  Filled 2013-09-11: qty 4

## 2013-09-11 MED ORDER — PHENYLEPHRINE HCL 10 MG/ML IJ SOLN
30.0000 ug/min | INTRAVENOUS | Status: AC
  Administered 2013-09-12: 10 ug/min via INTRAVENOUS
  Filled 2013-09-11: qty 2

## 2013-09-11 MED ORDER — CARVEDILOL 3.125 MG PO TABS
3.1250 mg | ORAL_TABLET | Freq: Two times a day (BID) | ORAL | Status: DC
  Filled 2013-09-11 (×2): qty 1

## 2013-09-11 NOTE — Progress Notes (Addendum)
TCTS BRIEF PROGRESS NOTE   CT angio chest reviewed.  The patient does not have a significant aneurysm of the ascending thoracic aorta.  There is mild dilatation which reaches a reported maximum diameter of 4.6 cm.  I think it's actually smaller than that and the measurements are exaggerated by the angle and location where they were taken.  The size may be further overestimated due motion associated with the cardiac cycle as all of these scans were non-gated studies.  On sagittal and coronal images the aorta appears normal.  It appears stable in comparison with a CTA performed 06/27/2013.    I doubt that it will need to be replaced but we will take a look during surgery.  External banding aortoplasty may be considered.   CT ANGIOGRAPHY CHEST WITH CONTRAST  TECHNIQUE:  Multidetector CT imaging of the chest was performed using the  standard protocol during bolus administration of intravenous  contrast. Multiplanar CT image reconstructions and MIPs were  obtained to evaluate the vascular anatomy.  CONTRAST: OMNIPAQUE IOHEXOL 350 MG/ML SOLN  COMPARISON: Chest x-ray 07/29/2013. CT chest 06/27/2013.  FINDINGS:  Thoracic aorta appears stable. Dilatation of the ascending aorta 4.6  cm again noted. No dissection. Pulmonary arteries are normal.Stable  cardiomegaly. Coronary artery disease.  No significant mediastinal adenopathy noted. Thoracic esophagus is  unremarkable.  Large airways are patent. Basilar atelectasis and tiny bilateral  pleural effusion. Previously identified subpleural nodular densities  are less well identified due to the basilar atelectasis present and  tiny pleural effusions present.  Adrenals are normal. Simple cyst left kidney. Visualized upper  abdominal structures otherwise unremarkable.  Degenerative changes lumbar spine. Punctate stable density lower  vertebral body most likely bone island .  Review of the MIP images confirms the above findings.  IMPRESSION:  1.  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  2. No evidence of aortic dissection.  3. Stable cardiomegaly. Coronary artery disease.  4. Mild bibasilar atelectasis. Tiny bilateral effusions. Previously  identified subpleural nodular densities are not well identified due  to the basilar atelectasis and tiny pleural effusion present .  Electronically Signed  By: Maisie Fus Register  On: 09/11/2013 20:49   Frank Moreno 09/11/2013 8:41 PM

## 2013-09-11 NOTE — Consult Note (Addendum)
[address]       [place] [address]             [phone]          CARDIOTHORACIC SURGERY CONSULTATION REPORT  PCP is Jolene Provost, MD Referring Provider is Chrystie Nose., MD   Reason for consultation:  Severe 3-vessel CAD with unstable angina  HPI:  Patient is a 63 year old obese white male with known history of coronary artery disease, hypertension, hyperlipidemia, type 2 diabetes mellitus, and recently discovered thoracic aortic aneurysm who has been referred for possible surgical treatment of coronary artery disease. The patient's cardiac history dates back to 1999 when he was found to have single-vessel coronary artery disease by catheterization. He was treated medically. The patient lives a somewhat sedentary lifestyle but was otherwise in his usual state of health until May of this year when he developed severe septic shock following dental extraction of an infected tooth. He was hospitalized at [facility] in [place] for a total of 4 days and recovered. During that hospitalization he was found to have an aneurysm of the ascending thoracic aorta which the patient was told measured 4.9 cm in diameter. He was evaluated by cardiac surgeon and told that surgery was not indicated at this time. The patient was also referred to a cardiologist and apparently underwent a nuclear stress test that reportedly demonstrated resting ejection fraction 64% and no signs of significant ischemia or previous myocardial infarction.  However, over the past several months the patient has experienced persistent and progressive symptoms of exertional chest discomfort and shortness of breath. Symptoms have increased in frequency and severity, and recently have been occurring intermittently at rest and with minimal activity. Symptoms are releived by rest or administration of sublingual nitroglycerin. The patient has had nocturnal angina that has awoken from his sleep. He  denies any episodes of prolonged chest pain lasting more than 10-15 minutes.  He states that he does not get short of breath unless he is having chest discomfort, but recently he has gotten to the point where he can not do much of any physical activity. He has had some dizzy spells without syncope. He has not had palpitations. He denies any history PND, orthopnea, or lower extremity edema.  He was evaluated by Dr. Rennis Golden on 08/29/2013 and scheduled for elective cardiac catheterization. Cardiac catheterization performed earlier today demonstrates critical three-vessel coronary artery disease with preserved left ventricular function. Cardiothoracic surgical consultation was requested.  The patient lives with his wife in [place]. 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  He lives a sedentary lifestyle. Prior to his illness in may the patient reports no particular physical limitations. 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 He does not exercise on regular basis.   Past Medical History  Diagnosis Date  . Obesity (BMI 30.0-34.9) 02/25/2013  . CAD (coronary artery disease) 02/25/2013  . DM2 (diabetes mellitus, type 2) 02/25/2013  . Dyslipidemia 02/25/2013  . HTN (hypertension) 02/25/2013  . Unstable angina 08/30/2013  . Thoracic ascending aortic aneurysm     discovered on CT scan 05/2013  . Arthritis     Past Surgical History  Procedure Laterality Date  . Cardiac catheterization    . Nasal septum surgery    . Scrotum exploration      Family History  Problem Relation Age of Onset  . Heart  disease Mother   . Heart disease Maternal Grandfather     History   Social History  . Marital Status: Married    Spouse Name: N/A    Number of Children: N/A  . Years of Education: N/A   Occupational History  . Not on file.   Social History Main Topics  . Smoking status: Never Smoker   . Smokeless  tobacco: Never Used  . Alcohol Use: No  . Drug Use: No  . Sexual Activity: Not on file   Other Topics Concern  . Not on file   Social History Narrative  . No narrative on file    Prior to Admission medications   Medication Sig Start Date End Date Taking? Authorizing Provider  allopurinol (ZYLOPRIM) 300 MG tablet Take 300 mg by mouth daily.   Yes Historical Provider, MD  aspirin 81 MG tablet Take 81 mg by mouth daily.   Yes Historical Provider, MD  Canagliflozin-Metformin HCl (INVOKAMET) 50-1000 MG TABS Take 1 tablet by mouth 2 (two) times daily.   Yes Historical Provider, MD  carvedilol (COREG) 3.125 MG tablet Take 3.125 mg by mouth 2 (two) times daily with a meal.   Yes Historical Provider, MD  Cholecalciferol (VITAMIN D-3) 1000 UNITS CAPS Take 4,000 Units by mouth daily.   Yes Historical Provider, MD  hydrochlorothiazide (HYDRODIURIL) 25 MG tablet Take 12.5 mg by mouth daily.    Yes Historical Provider, MD  HYDROcodone-acetaminophen (NORCO/VICODIN) 5-325 MG per tablet Take 1 tablet by mouth every 6 (six) hours as needed for moderate pain.   Yes Historical Provider, MD  nitroGLYCERIN (NITROSTAT) 0.4 MG SL tablet Place 0.4 mg under the tongue every 5 (five) minutes as needed for chest pain.   Yes Historical Provider, MD  piroxicam (FELDENE) 20 MG capsule Take 20 mg by mouth daily. 06/07/13 06/07/14 Yes Historical Provider, MD  pyridoxine (B-6) 100 MG tablet Take 200 mg by mouth daily.   Yes Historical Provider, MD  simvastatin (ZOCOR) 80 MG tablet Take 80 mg by mouth daily.   Yes Historical Provider, MD  testosterone (ANDROGEL) 50 MG/5GM GEL Place 10 g onto the skin daily.   Yes Historical Provider, MD  tiZANidine (ZANAFLEX) 4 MG tablet Take 4 mg by mouth 2 (two) times daily as needed for muscle spasms.  05/09/13  Yes Historical Provider, MD  vitamin C (ASCORBIC ACID) 250 MG tablet Take 500 mg by mouth daily.   Yes Historical Provider, MD  albuterol (PROVENTIL HFA;VENTOLIN HFA) 108 (90 BASE)  MCG/ACT inhaler Inhale 1-2 puffs into the lungs every 6 (six) hours as needed for wheezing or shortness of breath.    Historical Provider, MD  sildenafil (VIAGRA) 50 MG tablet Take 50 mg by mouth as needed for erectile dysfunction.     Historical Provider, MD    Current Facility-Administered Medications  Medication Dose Route Frequency Provider Last Rate Last Dose  . 0.9 %  sodium chloride infusion   Intravenous Continuous Purcell Nails, MD      . acetaminophen (TYLENOL) tablet 650 mg  650 mg Oral Q4H PRN Chrystie Nose, MD      . albuterol (PROVENTIL) (2.5 MG/3ML) 0.083% nebulizer solution 2.5 mg  2.5 mg Nebulization Q6H PRN Chrystie Nose, MD      . Mitzi Hansen HOLD] allopurinol (ZYLOPRIM) tablet 300 mg  300 mg Oral Daily Chrystie Nose, MD      . Melene Muller ON 09/12/2013] aminocaproic acid (AMICAR) 10 g in sodium chloride 0.9 % 100 mL  infusion   Intravenous To OR [facility] [place], [place]      . [facility] HOLD] aspirin chewable tablet 81 mg  81 mg Oral Daily Chrystie Nose, MD      . Mitzi Hansen HOLD] atorvastatin (LIPITOR) tablet 40 mg  40 mg Oral q1800 Chrystie Nose, MD      . bisacodyl (DULCOLAX) EC tablet 5 mg  5 mg Oral Once Purcell Nails, MD      . Mitzi Hansen HOLD] carvedilol (COREG) tablet 3.125 mg  3.125 mg Oral BID WC Chrystie Nose, MD      . Mitzi Hansen HOLD] cholecalciferol (VITAMIN D) tablet 4,000 Units  4,000 Units Oral Daily Chrystie Nose, MD      . Melene Muller ON 09/12/2013] dexmedetomidine (PRECEDEX) 400 MCG/100ML (4 mcg/mL) infusion  0.1-0.7 mcg/kg/hr Intravenous To OR [facility] [place], [facility]      . [START ON 09/12/2013] DOPamine (INTROPIN) 800 mg in dextrose 5 % 250 mL (3.2 mg/mL) infusion  2-20 mcg/kg/min Intravenous To OR [facility] [place], [facility]      . [START ON 09/12/2013] EPINEPHrine (ADRENALIN) 4 mg in dextrose 5 % 250 mL (0.016 mg/mL) infusion  0.5-20 mcg/min Intravenous To OR [facility] [place], [facility]      . [START ON 09/12/2013] heparin 2,500 Units, papaverine 30 mg in  electrolyte-148 (PLASMALYTE-148) 500 mL irrigation   Irrigation To OR [facility] [place], [facility]      . [START ON 09/12/2013] heparin 30,000 units/NS 1000 mL solution for CELLSAVER   Other To OR [facility], [facility]      . [MAR HOLD] hydrochlorothiazide (HYDRODIURIL) tablet 12.5 mg  12.5 mg Oral Daily Chrystie Nose, MD      . Mitzi Hansen HOLD] HYDROcodone-acetaminophen (NORCO/VICODIN) 5-325 MG per tablet 1 tablet  1 tablet Oral Q6H PRN Chrystie Nose, MD      . Melene Muller ON 09/12/2013] insulin regular (NOVOLIN R,HUMULIN R) 250 Units in sodium chloride 0.9 % 250 mL (1 Units/mL) infusion   Intravenous To OR [facility] [place], [facility]      . [START ON 09/12/2013] levofloxacin (LEVAQUIN) IVPB 500 mg  500 mg Intravenous To OR [facility] [place], [facility]      . [START ON 09/12/2013] magnesium sulfate (IV Push/IM) injection 40 mEq  40 mEq Other To OR [facility] [place], [facility]      . [START ON 09/12/2013] metoprolol tartrate (LOPRESSOR) tablet 12.5 mg  12.5 mg Oral Once Purcell Nails, MD      . Mitzi Hansen HOLD] nitroGLYCERIN (NITROSTAT) SL tablet 0.4 mg  0.4 mg Sublingual Q5 min PRN Chrystie Nose, MD      . Melene Muller ON 09/12/2013] nitroGLYCERIN 50 mg in dextrose 5 % 250 mL (0.2 mg/mL) infusion  2-200 mcg/min Intravenous To OR [facility], [facility]      . ondansetron [facility]) injection 4 mg  4 mg Intravenous Q6H PRN Chrystie Nose, MD      . Melene Muller ON 09/12/2013] phenylephrine (NEO-SYNEPHRINE) 20 mg in dextrose 5 % 250 mL (0.08 mg/mL) infusion  30-200 mcg/min Intravenous To OR [facility] [place], [facility]      . [START ON 09/12/2013] potassium chloride injection 80 mEq  80 mEq Other To OR [facility] [place], [facility]      . [MAR HOLD] pyridOXINE (VITAMIN B-6) tablet 200 mg  200 mg Oral Daily Chrystie Nose, MD      . temazepam (RESTORIL) capsule 15 mg  15 mg Oral Once PRN Salvatore Decent  Cornelius Moras, MD      . [facility] HOLD] testosterone (ANDROGEL) 50 MG/5GM (1%) gel 10 g  10 g Transdermal Daily Chrystie Nose,  MD      . Mitzi Hansen HOLD] tiZANidine (ZANAFLEX) tablet 4 mg  4 mg Oral BID PRN Chrystie Nose, MD      . Melene Muller ON 09/12/2013] vancomycin (VANCOCIN) 1,000 mg in sodium chloride 0.9 % 1,000 mL irrigation   Irrigation To OR [facility] [place], [facility]      . [START ON 09/12/2013] vancomycin (VANCOCIN) 1,500 mg in sodium chloride 0.9 % 250 mL IVPB  1,500 mg Intravenous To OR [facility] [place], [facility]      . [MAR HOLD] vitamin C (ASCORBIC ACID) tablet 500 mg  500 mg Oral Daily Chrystie Nose, MD        Allergies  Allergen Reactions  . Ceclor [Cefaclor] Rash  . Penicillins Rash      Review of Systems:   General:  normal appetite, decreased energy, no weight gain, no weight loss, no fever  Cardiac:  + chest pain with exertion, + chest pain at rest, + SOB with exertion, no resting SOB, no PND, no orthopnea, no palpitations, no arrhythmia, no atrial fibrillation, no LE edema, + dizzy spells, no syncope  Respiratory:  + exertional shortness of breath, no home oxygen, no productive cough, no dry cough, no bronchitis, no wheezing, no hemoptysis, no asthma, no pain with inspiration or cough, no sleep apnea, no CPAP at night  GI:   no difficulty swallowing, no reflux, no frequent heartburn, no hiatal hernia, no abdominal pain, no constipation, no diarrhea, no hematochezia, no hematemesis, no melena  GU:   no dysuria,  no frequency, no urinary tract infection, no hematuria, no enlarged prostate, no kidney stones, no kidney disease, + erectile dysfunction  Vascular:  no pain suggestive of claudication, no pain in feet, no leg cramps, no varicose veins, no DVT, no non-healing foot ulcer  Neuro:   no stroke, no TIA's, no seizures, no headaches, no temporary blindness one eye,  no slurred speech, no peripheral neuropathy, no chronic pain, no instability of gait, no memory/cognitive dysfunction  Musculoskeletal: + arthritis, no joint swelling, no myalgias, no difficulty walking, normal mobility   Skin:   no  rash, no itching, no skin infections, no pressure sores or ulcerations  Psych:   no anxiety, no depression, no nervousness, no unusual recent stress  Eyes:   no blurry vision, no floaters, no recent vision changes, + wears glasses or contacts  ENT:   + hearing loss, no loose or painful teeth, no dentures, last saw dentist within the past few months  Hematologic:  no easy bruising, no abnormal bleeding, no clotting disorder, no frequent epistaxis  Endocrine:  + diabetes, does not check CBG's at home     Physical Exam:   BP 140/88  Pulse 61  Temp(Src) 98.2 F (36.8 C) (Oral)  Resp 18  Ht  (1.905 m)  Wt 122.018 kg (269 lb)  BMI 33.62 kg/m2  SpO2 95%  General:  Obese,  well-appearing  HEENT:  Unremarkable   Neck:   no JVD, no bruits, no adenopathy   Chest:   clear to auscultation, symmetrical breath sounds, no wheezes, no rhonchi   CV:   RRR, no murmur   Abdomen:  soft, non-tender, no masses   Extremities:  warm, well-perfused, pulses diminished, no lower extremity edema  Rectal/GU  Deferred  Neuro:   Grossly non-focal and symmetrical  throughout  Skin:   Clean and dry, no rashes, no breakdown  Diagnostic Tests:  CARDIAC CATHETERIZATION REPORT  Loraine Freid [id number]  [birth year]  Performing Cardiologist: Chrystie Nose  Primary Physician: Jolene Provost, MD  Primary Cardiologist: Dr. Rennis Golden  Procedures Performed:  Left Heart Catheterization via 5 Fr right radial artery access  Left Ventriculography, (RAO/LAO) 30 ml/sec for 15 ml total contrast  Native Coronary Angiography Indication(s): chest pain  Pre-Procedural Diagnosis(es):  1. Unstable angina Post-Procedural Diagnosis(es):  1. Multivessel CAD Pre-Procedural Non-invasive testing: Normal test.  History: 63 y.o. male presented with is a pleasant 63 year old male who was recently followed by [facility] Luberta Robertson [facility] [place]. 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 Mr. Flannery previously  worked for the [employer] and is now retired. 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 He does have a history of coronary artery disease and was found to have a 50% mid circumflex stenosis by cath and 1999. At that time he was on no medical therapy for coronary disease and subsequently was placed on a statin, beta blocker and ARB and aspirin. He notes he was hospitalized in May after a tooth extraction at his regular dentist. Following this he became septic and was in the ICU on antibiotics. Was in the hospital for 4 days. Found AAA while in the hospital. Notes dyspnea, diaphoresis, and no energy since leaving hospital. This occurs intermittently. His last episode of this occurred Tuesday and had episode of sharp chest pain when pick up a watermelon. Pain is in the middle of his chest and occurs in the middle of his back. Like being stabbed with an ice pick and sometimes it is a tight feeling. Sometimes moves to arms and rarely in his back. Has had dyspnea walking to the mailbox, about 100 feet of walking. This occurs intermittently. Chest pain comes with moderate activity such as weed eating. Can also occur at rest. The only thing that relieves the pain is resting. Has previously had shortness of breath and chest pain though this has worsened since his hospitalization. Denies syncope and palpitations. No orthopnea or PND. He does note he has taken nitroglycerin in the past for his chest pain with some benefit. He notes he infrequently takes cialis and was not aware that he should not mix the cialis and nitroglycerin. Has seen CT surgery for AAA since discharge from the hospital. Told he would not have surgery due to the size being less than 5.5 cm. Notes no abdominal pain. Does note cramps in feet and legs at night. Notes he was referred to cardiology to see Dr Leeann Must. Had a stress test in [facility] system that revealed no infarct or ischemia, EF of 64%, and normal wall motion. Doubled  his coreg at that visit. Decreased his HCTZ to 12.5 mg.  Risks / Complications include, but not limited to: Death, MI, CVA/TIA, VF/VT (with defibrillation), Bradycardia (need for temporary pacer placement), contrast induced nephropathy, bleeding / bruising / hematoma / pseudoaneurysm, vascular or coronary injury (with possible emergent CT or Vascular Surgery), adverse medication reactions, infection.  Consent:  Risks of procedure as well as the alternatives and risks of each were explained to the (patient/caregiver). Consent for procedure obtained.  Procedure: The patient was brought to the 2nd Floor [place] [facility] in the fasting state and prepped and draped in the usual sterile fashion for (Right radial) access. A modified Allen's test with plethysmography was  performed on the right wrist demonstrating adequate Ulnar Artery collateral flow.  Time Out: Verified patient identification, verified procedure, site/side was marked, verified correct patient position, special equipment/implants available, radiation safety measures in place (including badges and shielding), medications/allergies/relevent history reviewed, required imaging and test results available. Performed  Procedure:  The right wrist was anesthetized with 1% subcutaneous Lidocaine. The right radial artery was accessed using the Seldinger Technique with placement of a 6 Fr Glide Sheath. The sheath was aspirated and flushed. Then a total of 10 ml of standard Radial Artery Cocktail (see medications) was infused. A 5 Fr TIG 4.0 Catheter was advanced of over a Safety J wire into the ascending Aorta. The catheter was used to engage the left and right coronary artery. Multiple cineangiographic views of the left coronary artery system(s) were performed. This catheter was then exchanged over the Long Exchange Safety J wire for a JR4 catheter and the right coronary artery system was evaluated. Then an exchange was made over a wire  for an angled Pigtail catheter that was advanced across the Aortic Valve. LV hemodynamics were measured (and) Left Ventriculography was performed. LV hemodynamics were then re-sampled, and the catheter was pulled back across the Aortic Valve for measurement of "pull-back" gradient. The catheter and the wire were removed completely out of the body.  The sheath was removed in the Cath Lab with a TR band placed at 12 ml Air at 0925 (time). Reverse Allen's test did reveal non-occlusive hemostasis.  Recovery:  The patient was transported to the cath lab holding area in stable condition.  The patient was stable before, during and following the procedure.  Patient did tolerate procedure well.  There were not complications.  EBL: Minimal  Medications:  Premedication: none  Sedation: 3 mg IV Versed, 75 mcg IV Fentanyl  Contrast: 100 ml Omnipaque  Local Anesthesia: 5 cc 1% lidocaine  5000 U IV Heparin  10 cc radial cocktail  50 mcg subcutanous nitroglycerin  Hemodynamics:  Central Aortic Pressure / Mean Aortic Pressure: 117/75  LV Pressure / LV End diastolic Pressure: 12  Left Ventriculography:  EF: 60-65%  Wall Motion: normal  MR: 0  Coronary Angiographic Data:  Left Main: Normal  Left Anterior Descending (LAD): There is a calcified, discrete proximal stenosis that is 95%, the LAD reaches the apex and gives off small diagonal branches  1st diagonal (D1): Small caliber, no disease  2nd diagonal (D2): Small caliber  Circumflex (LCx): There is a 98% proximal to mid vessel stenosis (distal to a high OM1 takeoff)  1st obtuse marginal: High OM1 takeoff, mild 30% mid-vessel stenosis  Right Coronary Artery: Large dominant vessel. Mild luminal irregularities until the PDA/PLB junction, where there is 95% stenosis.  right ventricle branch of right coronary artery: normal  posterior descending artery: Ostial 95% stenosis.  posterior lateral branch: Ostial moderate to severe stenosis at the  bifurcation. Impression:  1. Severe proximal LAD, LCX and distal PLB/PDA bifurcation stenosis.  2. False negative stress test  3. LVEF 60-65%, normal wall motion  4. LVEDP = 12 mmHg  Plan:  1. CABG is recommended with multivessel CAD in a diabetic.  2. CT surgery has evaluated .Marland Kitchen Will admit for CABG this week.  The case and results was discussed with the patient and family if available.  The case and results was not discussed with the patient's PCP.  The case and results was discussed with the patient's Cardiologist.  Time Spent Directly with the Patient:  60 minutes  Chrystie Nose, MD, [facility]  Attending Cardiologist  [facility]  HILTY,Kenneth C  09/11/2013, 9:48 AM       Impression:  Patient has critical three-vessel coronary artery disease with preserved left ventricular systolic function and presents with symptoms consistent with unstable angina. I agree the patient would best be treated with surgical revascularization. The patient reportedly was discovered to have an aneurysm of the ascending thoracic aorta during his hospitalization in [place] earlier this year. This will need to be formally imaged using CT angiography to determine whether or not concomitant repair of the aorta should be performed at the time of coronary artery bypass surgery.    Plan:  I have reviewed the indications, risks, and potential benefits of coronary artery bypass grafting with the patient and his wife.  Alternative treatment strategies have been discussed.  The patient understands and accepts all potential associated risks of surgery including but not limited to risk of death, stroke or other neurologic complication, myocardial infarction, congestive heart failure, respiratory failure, renal failure, bleeding requiring blood transfusion and/or reexploration, aortic dissection or other major vascular complication, arrhythmia, heart block or bradycardia requiring permanent pacemaker, pneumonia,  pleural effusion, wound infection, pulmonary embolus or other thromboembolic complication, chronic pain or other delayed complications related to median sternotomy, or the late recurrence of symptomatic ischemic heart disease and/or congestive heart failure.  The importance of long term risk modification have been emphasized.  We plan to proceed with surgery first thing tomorrow morning. We will obtain CT angiogram of the chest this evening to formally evaluate the patient's known aortic aneurysm. Depending upon the size and anatomical characteristics of the aneurysm we may need to consider concomitant repair.  All questions answered.  Because of the need for additional IV contrast we will begin IV hydration overnight.   I spent in excess of 120 minutes during the conduct of this hospital consultation and >50% of this time involved direct face-to-face encounter for counseling and/or coordination of the patient's care.   Salvatore Decent. Cornelius Moras, MD 09/11/2013 5:43 PM

## 2013-09-11 NOTE — Progress Notes (Signed)
Consent for Coronary artery bypass grafting and blood signed by patient and placed in chart. Rise Paganini, RN

## 2013-09-11 NOTE — Progress Notes (Signed)
Called and gave report to [place], RN on [facility].  Patient transferred via wheelchair.

## 2013-09-11 NOTE — H&P (Addendum)
     INTERVAL PROCEDURE H&P  History and Physical Interval Note:  09/11/2013 8:20 AM  Frank Moreno. has presented today for their planned procedure. The various methods of treatment have been discussed with the patient and family. After consideration of risks, benefits and other options for treatment, the patient has consented to the procedure.  The patients' outpatient history has been reviewed, patient examined, and no change in status from most recent office note within the past 30 days. I have reviewed the patients' chart and labs and will proceed as planned. Questions were answered to the patient's satisfaction.   Cath Lab Visit (complete for each Cath Lab visit)  Clinical Evaluation Leading to the Procedure:   ACS: No.  Non-ACS:    Anginal Classification: CCS III  Anti-ischemic medical therapy: Maximal Therapy (2 or more classes of medications)  Non-Invasive Test Results: No non-invasive testing performed  Prior CABG: No previous CABG   Chrystie Nose, MD, [facility] Attending Cardiologist [facility]  HILTY,Kenneth C 09/11/2013, 8:20 AM

## 2013-09-11 NOTE — CV Procedure (Addendum)
CARDIAC CATHETERIZATION REPORT  Frank Moreno   [id number] [birth year]  Performing Cardiologist: Frank Moreno Primary Physician: Frank Provost, MD Primary Cardiologist:  Dr. Rennis Moreno  Procedures Performed:  Left Heart Catheterization via 5 Fr right radial artery access  Left Ventriculography, (RAO/LAO) 30 ml/sec for 15 ml total contrast  Native Coronary Angiography  Indication(s): chest pain  Pre-Procedural Diagnosis(es):  1. Unstable angina  Post-Procedural Diagnosis(es): 1. Multivessel CAD  Pre-Procedural Non-invasive testing: Normal test.  History: 63 y.o. male presented with is a pleasant 63 year old male who was recently followed by [facility] [place]. 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 [name] previously worked for the [employer] and is now retired. 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 He does have a history of coronary artery disease and was found to have a 50% mid circumflex stenosis by cath and 1999. At that time he was on no medical therapy for coronary disease and subsequently was placed on a statin, beta blocker and ARB and aspirin.  He notes he was hospitalized in May after a tooth extraction at his regular dentist. Following this he became septic and was in the ICU on antibiotics. Was in the hospital for 4 days. Found AAA while in the hospital. Notes dyspnea, diaphoresis, and no energy since leaving hospital. This occurs intermittently. His last episode of this occurred Tuesday and had episode of sharp chest pain when pick up a watermelon. Pain is in the middle of his chest and occurs in the middle of his back. Like being stabbed with an ice pick and sometimes it is a tight feeling. Sometimes moves to arms and rarely in his back. Has had dyspnea walking to the mailbox, about 100 feet of walking. This occurs intermittently. Chest pain comes with moderate activity such as weed eating. Can  also occur at rest. The only thing that relieves the pain is resting. Has previously had shortness of breath and chest pain though this has worsened since his hospitalization. Denies syncope and palpitations. No orthopnea or PND. He does note he has taken nitroglycerin in the past for his chest pain with some benefit. He notes he infrequently takes cialis and was not aware that he should not mix the cialis and nitroglycerin.  Has seen CT surgery for AAA since discharge from the hospital. Told he would not have surgery due to the size being less than 5.5 cm. Notes no abdominal pain. Does note cramps in feet and legs at night. Notes he was referred to cardiology to see Dr Frank Moreno. Had a stress test in [facility] system that revealed no infarct or ischemia, EF of 64%, and normal wall motion. Doubled his coreg at that visit. Decreased his HCTZ to 12.5 mg.  Risks / Complications include, but not limited to: Death, MI, CVA/TIA, VF/VT (with defibrillation), Bradycardia (need for temporary pacer placement), contrast induced nephropathy, bleeding / bruising / hematoma / pseudoaneurysm, vascular or coronary injury (with possible emergent CT or Vascular Surgery), adverse medication reactions, infection.    Consent: Risks of procedure as well as the alternatives and risks of each were explained to the (patient/caregiver).  Consent for procedure obtained.  Procedure: The patient was brought to the 2nd Floor [facility] in the fasting state and prepped and draped in the usual sterile fashion for (Right radial) access. A modified Allen's test with plethysmography was performed on the right wrist demonstrating adequate Ulnar Artery  collateral flow.    Time Out: Verified patient identification, verified procedure, site/side was marked, verified correct patient position, special equipment/implants available, radiation safety measures in place (including badges and shielding),  medications/allergies/relevent history reviewed, required imaging and test results available.  Performed  Procedure: The right wrist was anesthetized with 1% subcutaneous Lidocaine.  The right radial artery was accessed using the Seldinger Technique with placement of a 6 Fr Glide Sheath. The sheath was aspirated and flushed.  Then a total of 10 ml of standard Radial Artery Cocktail (see medications) was infused.  A 5 Fr TIG 4.0 Catheter was advanced of over a Safety J wire into the ascending Aorta.  The catheter was used to engage the left and right coronary artery.  Multiple cineangiographic views of the left coronary artery system(s) were performed. This catheter was then exchanged over the Long Exchange Safety J wire for a JR4 catheter and the right coronary artery system was evaluated. Then an exchange was made over a wire for an angled Pigtail catheter that was advanced across the Aortic Valve.  LV hemodynamics were measured (and) Left Ventriculography was performed.  LV hemodynamics were then re-sampled, and the catheter was pulled back across the Aortic Valve for measurement of "pull-back" gradient.  The catheter and the wire were removed completely out of the body.  The sheath was removed in the Cath Lab with a TR band placed at 12 ml Air at 0925 (time).  Reverse Allen's test did  reveal non-occlusive hemostasis.  Recovery: The patient was transported to the cath lab holding area in stable condition.   The patient  was stable before, during and following the procedure.   Patient did tolerate procedure well. There were not complications.  EBL: Minimal  Medications:  Premedication: none  Sedation:  3 mg IV Versed, 75 mcg IV Fentanyl  Contrast:  100 ml Omnipaque  Local Anesthesia: 5 cc 1% lidocaine  5000 U IV Heparin  10 cc radial cocktail  50 mcg subcutanous nitroglycerin  Hemodynamics:  Central Aortic Pressure / Mean Aortic Pressure: 117/75   LV Pressure / LV End diastolic Pressure:   12  Left Ventriculography:  EF:  60-65%  Wall Motion: normal  MR: 0  Coronary Angiographic Data:  Left Main:  Normal  Left Anterior Descending (LAD):  There is a calcified, discrete proximal stenosis that is 95%, the LAD reaches the apex and gives off small diagonal branches  1st diagonal (D1):  Small caliber, no disease  2nd diagonal (D2):  Small caliber  Circumflex (LCx):  There is a 98% proximal to mid vessel stenosis (distal to a high OM1 takeoff)  1st obtuse marginal:  High OM1 takeoff, mild 30% mid-vessel stenosis  Right Coronary Artery: Large dominant vessel. Mild luminal irregularities until the PDA/PLB junction, where there is 95% stenosis.  right ventricle branch of right coronary artery: normal  posterior descending artery: Ostial 95% stenosis.  posterior lateral branch:  Ostial moderate to severe stenosis at the bifurcation.  Impression: 1.  Severe proximal LAD, LCX and distal PLB/PDA bifurcation stenosis. 2.  False negative stress test 3.  LVEF 60-65%, normal wall motion 4.  LVEDP = 12 mmHg  Plan: 1.  CABG is recommended with multivessel CAD in a diabetic. 2.  CT surgery has evaluated .Marland Kitchen Will admit for CABG this week.  The case and results was discussed with the patient and family if available.  The case and results was not discussed with the patient's PCP. The case and results was  discussed with the patient's Cardiologist.  Time Spent Directly with the Patient:  60 minutes  Frank Nose, MD, [facility] Attending Cardiologist [facility]  Laura Radilla C 09/11/2013, 9:48 AM

## 2013-09-11 NOTE — Research (Signed)
Bioflow Informed Consent   Subject Name: Frank Moreno.  Subject met inclusion and exclusion criteria.  The informed consent form, study requirements and expectations were reviewed with the subject and questions and concerns were addressed prior to the signing of the consent form.  The subject verbalized understanding of the trail requirements.  The subject agreed to participate in the Bioflow trial and signed the informed consent.  The informed consent was obtained prior to performance of any protocol-specific procedures for the subject.  A copy of the signed informed consent was given to the subject and a copy was placed in the subject's medical record.  Sandie Ano 09/11/2013,7:30

## 2013-09-11 NOTE — Progress Notes (Signed)
Did cardiac surgery education with patient, using teach back. Patient expressed understanding of what to expect before, during, and after surgery. Patient states that he "feels good" about the surgery. Patient also given cardiac surgery handouts and instructed to watch cardiac surgery video once his company leaves, will have night shift RN follow up with the video. Patient stated that at this moment he has no additional questions or concerns about the surgery. Rise Paganini, RN

## 2013-09-12 ENCOUNTER — Encounter (HOSPITAL_COMMUNITY): Payer: Self-pay | Admitting: Thoracic Surgery (Cardiothoracic Vascular Surgery)

## 2013-09-12 ENCOUNTER — Encounter (HOSPITAL_COMMUNITY): Payer: BC Managed Care – PPO | Admitting: Anesthesiology

## 2013-09-12 ENCOUNTER — Encounter (HOSPITAL_COMMUNITY)
Admission: RE | Disposition: A | Discharge status: Home / Self Care | Payer: BC Managed Care – PPO | Source: Ambulatory Visit | Attending: Thoracic Surgery (Cardiothoracic Vascular Surgery)

## 2013-09-12 ENCOUNTER — Inpatient Hospital Stay (HOSPITAL_COMMUNITY): Payer: BC Managed Care – PPO | Admitting: Anesthesiology

## 2013-09-12 ENCOUNTER — Inpatient Hospital Stay (HOSPITAL_COMMUNITY): Payer: BC Managed Care – PPO

## 2013-09-12 DIAGNOSIS — Z951 Presence of aortocoronary bypass graft: Secondary | ICD-10-CM

## 2013-09-12 HISTORY — PX: CORONARY ARTERY BYPASS GRAFT: SHX141

## 2013-09-12 HISTORY — PX: INTRAOPERATIVE TRANSESOPHAGEAL ECHOCARDIOGRAM: SHX5062

## 2013-09-12 LAB — POCT I-STAT, CHEM 8
BUN: 13 mg/dL (ref 6–23)
BUN: 13 mg/dL (ref 6–23)
BUN: 11 mg/dL (ref 6–23)
BUN: 11 mg/dL (ref 6–23)
BUN: 13 mg/dL (ref 6–23)
BUN: 10 mg/dL (ref 6–23)
BUN: 15 mg/dL (ref 6–23)
CALCIUM ION: 1.04 mmol/L — AB (ref 1.13–1.30)
CALCIUM ION: 1.05 mmol/L — AB (ref 1.13–1.30)
CALCIUM ION: 1.08 mmol/L — AB (ref 1.13–1.30)
CHLORIDE: 104 meq/L (ref 96–112)
CHLORIDE: 106 meq/L (ref 96–112)
CREATININE: 0.6 mg/dL (ref 0.50–1.35)
CREATININE: 0.8 mg/dL (ref 0.50–1.35)
Calcium, Ion: 1.22 mmol/L (ref 1.13–1.30)
Calcium, Ion: 1.21 mmol/L (ref 1.13–1.30)
Calcium, Ion: 0.93 mmol/L — ABNORMAL LOW (ref 1.13–1.30)
Calcium, Ion: 1.1 mmol/L — ABNORMAL LOW (ref 1.13–1.30)
Chloride: 105 mEq/L (ref 96–112)
Chloride: 100 mEq/L (ref 96–112)
Chloride: 101 mEq/L (ref 96–112)
Chloride: 112 mEq/L (ref 96–112)
Chloride: 102 mEq/L (ref 96–112)
Creatinine, Ser: 0.7 mg/dL (ref 0.50–1.35)
Creatinine, Ser: 0.7 mg/dL (ref 0.50–1.35)
Creatinine, Ser: 0.7 mg/dL (ref 0.50–1.35)
Creatinine, Ser: 0.7 mg/dL (ref 0.50–1.35)
Creatinine, Ser: 0.7 mg/dL (ref 0.50–1.35)
GLUCOSE: 126 mg/dL — AB (ref 70–99)
Glucose, Bld: 123 mg/dL — ABNORMAL HIGH (ref 70–99)
Glucose, Bld: 140 mg/dL — ABNORMAL HIGH (ref 70–99)
Glucose, Bld: 124 mg/dL — ABNORMAL HIGH (ref 70–99)
Glucose, Bld: 158 mg/dL — ABNORMAL HIGH (ref 70–99)
Glucose, Bld: 169 mg/dL — ABNORMAL HIGH (ref 70–99)
Glucose, Bld: 148 mg/dL — ABNORMAL HIGH (ref 70–99)
HCT: 50 % (ref 39.0–52.0)
HCT: 47 % (ref 39.0–52.0)
HCT: 36 % — ABNORMAL LOW (ref 39.0–52.0)
HCT: 39 % (ref 39.0–52.0)
HCT: 36 % — ABNORMAL LOW (ref 39.0–52.0)
HEMATOCRIT: 39 % (ref 39.0–52.0)
HEMATOCRIT: 36 % — AB (ref 39.0–52.0)
HEMOGLOBIN: 17 g/dL (ref 13.0–17.0)
HEMOGLOBIN: 12.2 g/dL — AB (ref 13.0–17.0)
HEMOGLOBIN: 12.2 g/dL — AB (ref 13.0–17.0)
Hemoglobin: 16 g/dL (ref 13.0–17.0)
Hemoglobin: 13.3 g/dL (ref 13.0–17.0)
Hemoglobin: 13.3 g/dL (ref 13.0–17.0)
Hemoglobin: 12.2 g/dL — ABNORMAL LOW (ref 13.0–17.0)
POTASSIUM: 4 meq/L (ref 3.7–5.3)
Potassium: 4.1 mEq/L (ref 3.7–5.3)
Potassium: 4.3 mEq/L (ref 3.7–5.3)
Potassium: 4.7 mEq/L (ref 3.7–5.3)
Potassium: 5.3 mEq/L (ref 3.7–5.3)
Potassium: 4.2 mEq/L (ref 3.7–5.3)
Potassium: 6.4 mEq/L — ABNORMAL HIGH (ref 3.7–5.3)
SODIUM: 138 meq/L (ref 137–147)
SODIUM: 138 meq/L (ref 137–147)
SODIUM: 137 meq/L (ref 137–147)
Sodium: 137 mEq/L (ref 137–147)
Sodium: 132 mEq/L — ABNORMAL LOW (ref 137–147)
Sodium: 135 mEq/L — ABNORMAL LOW (ref 137–147)
Sodium: 138 mEq/L (ref 137–147)
TCO2: 29 mmol/L (ref 0–100)
TCO2: 25 mmol/L (ref 0–100)
TCO2: 25 mmol/L (ref 0–100)
TCO2: 24 mmol/L (ref 0–100)
TCO2: 28 mmol/L (ref 0–100)
TCO2: 22 mmol/L (ref 0–100)
TCO2: 23 mmol/L (ref 0–100)

## 2013-09-12 LAB — POCT I-STAT 3, ART BLOOD GAS (G3+)
ACID-BASE DEFICIT: 1 mmol/L (ref 0.0–2.0)
Acid-base deficit: 2 mmol/L (ref 0.0–2.0)
Acid-base deficit: 3 mmol/L — ABNORMAL HIGH (ref 0.0–2.0)
Acid-base deficit: 1 mmol/L (ref 0.0–2.0)
BICARBONATE: 24.6 meq/L — AB (ref 20.0–24.0)
BICARBONATE: 24.3 meq/L — AB (ref 20.0–24.0)
Bicarbonate: 26.6 mEq/L — ABNORMAL HIGH (ref 20.0–24.0)
Bicarbonate: 24.7 mEq/L — ABNORMAL HIGH (ref 20.0–24.0)
Bicarbonate: 22.5 mEq/L (ref 20.0–24.0)
Bicarbonate: 23.3 mEq/L (ref 20.0–24.0)
O2 SAT: 100 %
O2 SAT: 100 %
O2 SAT: 95 %
O2 Saturation: 95 %
O2 Saturation: 96 %
O2 Saturation: 93 %
PCO2 ART: 41.4 mmHg (ref 35.0–45.0)
PCO2 ART: 38.9 mmHg (ref 35.0–45.0)
PH ART: 7.384 (ref 7.350–7.450)
PH ART: 7.371 (ref 7.350–7.450)
PH ART: 7.329 — AB (ref 7.350–7.450)
PH ART: 7.369 (ref 7.350–7.450)
PO2 ART: 354 mmHg — AB (ref 80.0–100.0)
PO2 ART: 71 mmHg — AB (ref 80.0–100.0)
TCO2: 28 mmol/L (ref 0–100)
TCO2: 26 mmol/L (ref 0–100)
TCO2: 24 mmol/L (ref 0–100)
TCO2: 25 mmol/L (ref 0–100)
TCO2: 26 mmol/L (ref 0–100)
TCO2: 26 mmol/L (ref 0–100)
pCO2 arterial: 49 mmHg — ABNORMAL HIGH (ref 35.0–45.0)
pCO2 arterial: 44.1 mmHg (ref 35.0–45.0)
pCO2 arterial: 44.3 mmHg (ref 35.0–45.0)
pCO2 arterial: 42.5 mmHg (ref 35.0–45.0)
pH, Arterial: 7.342 — ABNORMAL LOW (ref 7.350–7.450)
pH, Arterial: 7.355 (ref 7.350–7.450)
pO2, Arterial: 381 mmHg — ABNORMAL HIGH (ref 80.0–100.0)
pO2, Arterial: 77 mmHg — ABNORMAL LOW (ref 80.0–100.0)
pO2, Arterial: 81 mmHg (ref 80.0–100.0)
pO2, Arterial: 90 mmHg (ref 80.0–100.0)

## 2013-09-12 LAB — BASIC METABOLIC PANEL
ANION GAP: 11 (ref 5–15)
Anion gap: 11 (ref 5–15)
BUN: 16 mg/dL (ref 6–23)
BUN: 12 mg/dL (ref 6–23)
CALCIUM: 8.9 mg/dL (ref 8.4–10.5)
CO2: 28 meq/L (ref 19–32)
CO2: 21 mEq/L (ref 19–32)
CREATININE: 1 mg/dL (ref 0.50–1.35)
Calcium: 7.3 mg/dL — ABNORMAL LOW (ref 8.4–10.5)
Chloride: 103 mEq/L (ref 96–112)
Chloride: 106 mEq/L (ref 96–112)
Creatinine, Ser: 0.8 mg/dL (ref 0.50–1.35)
GFR calc Af Amer: >90 mL/min (ref >90)
GFR calc Af Amer: >90 mL/min (ref >90)
GFR calc non Af Amer: >90 mL/min (ref >90)
GFR, EST NON AFRICAN AMERICAN: 79 mL/min — AB (ref >90)
GLUCOSE: 104 mg/dL — AB (ref 70–99)
Glucose, Bld: 122 mg/dL — ABNORMAL HIGH (ref 70–99)
Potassium: 4.2 mEq/L (ref 3.7–5.3)
Potassium: 5.4 mEq/L — ABNORMAL HIGH (ref 3.7–5.3)
Sodium: 142 mEq/L (ref 137–147)
Sodium: 138 mEq/L (ref 137–147)

## 2013-09-12 LAB — CBC
HCT: 41.5 % (ref 39.0–52.0)
HEMATOCRIT: 48.2 % (ref 39.0–52.0)
HEMATOCRIT: 36.6 % — AB (ref 39.0–52.0)
Hemoglobin: 16.7 g/dL (ref 13.0–17.0)
Hemoglobin: 14.5 g/dL (ref 13.0–17.0)
Hemoglobin: 12.8 g/dL — ABNORMAL LOW (ref 13.0–17.0)
MCH: 30.4 pg (ref 26.0–34.0)
MCH: 30.3 pg (ref 26.0–34.0)
MCH: 30.3 pg (ref 26.0–34.0)
MCHC: 34.6 g/dL (ref 30.0–36.0)
MCHC: 34.9 g/dL (ref 30.0–36.0)
MCHC: 35 g/dL (ref 30.0–36.0)
MCV: 87.6 fL (ref 78.0–100.0)
MCV: 86.6 fL (ref 78.0–100.0)
MCV: 86.7 fL (ref 78.0–100.0)
Platelets: 155 10*3/uL (ref 150–400)
Platelets: 119 10*3/uL — ABNORMAL LOW (ref 150–400)
Platelets: 123 10*3/uL — ABNORMAL LOW (ref 150–400)
RBC: 5.5 MIL/uL (ref 4.22–5.81)
RBC: 4.79 MIL/uL (ref 4.22–5.81)
RBC: 4.22 MIL/uL (ref 4.22–5.81)
RDW: 14.2 % (ref 11.5–15.5)
RDW: 14.2 % (ref 11.5–15.5)
RDW: 14.3 % (ref 11.5–15.5)
WBC: 9.9 10*3/uL (ref 4.0–10.5)
WBC: 17.6 10*3/uL — AB (ref 4.0–10.5)
WBC: 13.6 10*3/uL — ABNORMAL HIGH (ref 4.0–10.5)

## 2013-09-12 LAB — POCT I-STAT GLUCOSE
GLUCOSE: 138 mg/dL — AB (ref 70–99)
Operator id: 286891

## 2013-09-12 LAB — POCT I-STAT 3, VENOUS BLOOD GAS (G3P V)
ACID-BASE DEFICIT: 1 mmol/L (ref 0.0–2.0)
Bicarbonate: 25.5 mEq/L — ABNORMAL HIGH (ref 20.0–24.0)
O2 SAT: 75 %
PO2 VEN: 43 mmHg (ref 30.0–45.0)
TCO2: 27 mmol/L (ref 0–100)
pCO2, Ven: 47 mmHg (ref 45.0–50.0)
pH, Ven: 7.342 — ABNORMAL HIGH (ref 7.250–7.300)

## 2013-09-12 LAB — PLATELET COUNT: PLATELETS: 119 10*3/uL — AB (ref 150–400)

## 2013-09-12 LAB — GLUCOSE, CAPILLARY: GLUCOSE-CAPILLARY: 151 mg/dL — AB (ref 70–99)

## 2013-09-12 LAB — URINALYSIS, ROUTINE W REFLEX MICROSCOPIC
BILIRUBIN URINE: NEGATIVE
Glucose, UA: 100 mg/dL — AB
HGB URINE DIPSTICK: NEGATIVE
Ketones, ur: 15 mg/dL — AB
Leukocytes, UA: NEGATIVE
NITRITE: NEGATIVE
PH: 6 (ref 5.0–8.0)
Protein, ur: NEGATIVE mg/dL
Specific Gravity, Urine: 1.038 — ABNORMAL HIGH (ref 1.005–1.030)
UROBILINOGEN UA: 0.2 mg/dL (ref 0.0–1.0)

## 2013-09-12 LAB — HEMOGLOBIN AND HEMATOCRIT, BLOOD
HEMATOCRIT: 39.6 % (ref 39.0–52.0)
Hemoglobin: 13.7 g/dL (ref 13.0–17.0)

## 2013-09-12 LAB — HEMOGLOBIN A1C
Hgb A1c MFr Bld: 6.5 % — ABNORMAL HIGH (ref <5.7)
Mean Plasma Glucose: 140 mg/dL — ABNORMAL HIGH (ref <117)

## 2013-09-12 LAB — LIPID PANEL
CHOL/HDL RATIO: 3.6 ratio
Cholesterol: 108 mg/dL (ref 0–200)
HDL: 30 mg/dL — AB (ref >39)
LDL CALC: 42 mg/dL (ref 0–99)
Triglycerides: 181 mg/dL — ABNORMAL HIGH (ref <150)
VLDL: 36 mg/dL (ref 0–40)

## 2013-09-12 LAB — MAGNESIUM: MAGNESIUM: 2.7 mg/dL — AB (ref 1.5–2.5)

## 2013-09-12 LAB — POCT I-STAT 4, (NA,K, GLUC, HGB,HCT)
Glucose, Bld: 119 mg/dL — ABNORMAL HIGH (ref 70–99)
HCT: 42 % (ref 39.0–52.0)
Hemoglobin: 14.3 g/dL (ref 13.0–17.0)
Potassium: 3.9 meq/L (ref 3.7–5.3)
Sodium: 140 meq/L (ref 137–147)

## 2013-09-12 LAB — PREALBUMIN: PREALBUMIN: 34.6 mg/dL — AB (ref 17.0–34.0)

## 2013-09-12 LAB — PROTIME-INR
INR: 1.31 (ref 0.00–1.49)
PROTHROMBIN TIME: 16.3 s — AB (ref 11.6–15.2)

## 2013-09-12 LAB — APTT: APTT: 29 s (ref 24–37)

## 2013-09-12 SURGERY — CORONARY ARTERY BYPASS GRAFTING (CABG)
Anesthesia: General | Site: Chest

## 2013-09-12 MED ORDER — SODIUM CHLORIDE 0.9 % IV SOLN
INTRAVENOUS | Status: AC
  Administered 2013-09-12: 16:00:00 via INTRAVENOUS

## 2013-09-12 MED ORDER — PHENYLEPHRINE HCL 10 MG/ML IJ SOLN
INTRAMUSCULAR | Status: DC | PRN
  Administered 2013-09-12 (×2): 80 ug via INTRAVENOUS

## 2013-09-12 MED ORDER — ALBUMIN HUMAN 5 % IV SOLN
INTRAVENOUS | Status: DC | PRN
  Administered 2013-09-12 (×3): via INTRAVENOUS

## 2013-09-12 MED ORDER — SUCCINYLCHOLINE CHLORIDE 20 MG/ML IJ SOLN
INTRAMUSCULAR | Status: DC | PRN
  Administered 2013-09-12: 120 mg via INTRAVENOUS

## 2013-09-12 MED ORDER — SODIUM CHLORIDE 0.9 % IJ SOLN
OROMUCOSAL | Status: DC | PRN
  Administered 2013-09-12 (×5): via TOPICAL

## 2013-09-12 MED ORDER — ROCURONIUM BROMIDE 50 MG/5ML IV SOLN
INTRAVENOUS | Status: AC
  Filled 2013-09-12: qty 1

## 2013-09-12 MED ORDER — MIDAZOLAM HCL 10 MG/2ML IJ SOLN
INTRAMUSCULAR | Status: AC
  Filled 2013-09-12: qty 2

## 2013-09-12 MED ORDER — VANCOMYCIN HCL IN DEXTROSE 1-5 GM/200ML-% IV SOLN
1000.0000 mg | Freq: Once | INTRAVENOUS | Status: AC
  Administered 2013-09-12: 1000 mg via INTRAVENOUS
  Filled 2013-09-12: qty 200

## 2013-09-12 MED ORDER — METOPROLOL TARTRATE 12.5 MG HALF TABLET
12.5000 mg | ORAL_TABLET | Freq: Two times a day (BID) | ORAL | Status: DC
  Filled 2013-09-12 (×3): qty 1

## 2013-09-12 MED ORDER — GLYCOPYRROLATE 0.2 MG/ML IJ SOLN
INTRAMUSCULAR | Status: AC
  Filled 2013-09-12: qty 1

## 2013-09-12 MED ORDER — EPHEDRINE SULFATE 50 MG/ML IJ SOLN
INTRAMUSCULAR | Status: AC
  Filled 2013-09-12: qty 1

## 2013-09-12 MED ORDER — PROTAMINE SULFATE 10 MG/ML IV SOLN
INTRAVENOUS | Status: AC
  Filled 2013-09-12: qty 25

## 2013-09-12 MED ORDER — SODIUM CHLORIDE 0.9 % IV SOLN: 250.0000 mL | INTRAVENOUS | Status: DC

## 2013-09-12 MED ORDER — ACETAMINOPHEN 650 MG RE SUPP
650.0000 mg | Freq: Once | RECTAL | Status: AC
  Administered 2013-09-12: 650 mg via RECTAL

## 2013-09-12 MED ORDER — BISACODYL 10 MG RE SUPP: 10.0000 mg | Freq: Every day | RECTAL | Status: DC

## 2013-09-12 MED ORDER — ACETAMINOPHEN 500 MG PO TABS
1000.0000 mg | ORAL_TABLET | Freq: Four times a day (QID) | ORAL | Status: AC
  Administered 2013-09-13 – 2013-09-17 (×19): 1000 mg via ORAL
  Filled 2013-09-12 (×20): qty 2

## 2013-09-12 MED ORDER — MIDAZOLAM HCL 2 MG/2ML IJ SOLN
INTRAMUSCULAR | Status: AC
  Filled 2013-09-12: qty 2

## 2013-09-12 MED ORDER — LACTATED RINGERS IV SOLN
INTRAVENOUS | Status: DC
  Administered 2013-09-12 – 2013-09-13 (×2): via INTRAVENOUS

## 2013-09-12 MED ORDER — EPHEDRINE SULFATE 50 MG/ML IJ SOLN
INTRAMUSCULAR | Status: DC | PRN
  Administered 2013-09-12: 2.5 mg via INTRAVENOUS

## 2013-09-12 MED ORDER — MORPHINE SULFATE 2 MG/ML IJ SOLN
2.0000 mg | INTRAMUSCULAR | Status: DC | PRN
  Administered 2013-09-12 (×5): 2 mg via INTRAVENOUS
  Administered 2013-09-13: 4 mg via INTRAVENOUS
  Administered 2013-09-13: 2 mg via INTRAVENOUS
  Administered 2013-09-13: 4 mg via INTRAVENOUS
  Administered 2013-09-13: 2 mg via INTRAVENOUS
  Filled 2013-09-12 (×5): qty 2
  Filled 2013-09-12: qty 1

## 2013-09-12 MED ORDER — FENTANYL CITRATE 0.05 MG/ML IJ SOLN
INTRAMUSCULAR | Status: DC | PRN
Start: 2013-09-12 — End: 2013-09-12
  Administered 2013-09-12: 250 ug via INTRAVENOUS
  Administered 2013-09-12: 200 ug via INTRAVENOUS
  Administered 2013-09-12 (×2): 250 ug via INTRAVENOUS
  Administered 2013-09-12: 50 ug via INTRAVENOUS
  Administered 2013-09-12: 250 ug via INTRAVENOUS

## 2013-09-12 MED ORDER — BISACODYL 5 MG PO TBEC
10.0000 mg | DELAYED_RELEASE_TABLET | Freq: Every day | ORAL | Status: DC
  Administered 2013-09-13 – 2013-09-19 (×6): 10 mg via ORAL
  Filled 2013-09-12 (×6): qty 2

## 2013-09-12 MED ORDER — 0.9 % SODIUM CHLORIDE (POUR BTL) OPTIME
TOPICAL | Status: DC | PRN
  Administered 2013-09-12: 6000 mL

## 2013-09-12 MED ORDER — INSULIN REGULAR BOLUS VIA INFUSION
0.0000 [IU] | Freq: Three times a day (TID) | INTRAVENOUS | Status: AC
  Filled 2013-09-12: qty 10

## 2013-09-12 MED ORDER — LACTATED RINGERS IV SOLN
INTRAVENOUS | Status: DC | PRN
  Administered 2013-09-12: 07:00:00 via INTRAVENOUS

## 2013-09-12 MED ORDER — FENTANYL CITRATE 0.05 MG/ML IJ SOLN
INTRAMUSCULAR | Status: AC
  Filled 2013-09-12: qty 5

## 2013-09-12 MED ORDER — MORPHINE SULFATE 2 MG/ML IJ SOLN
1.0000 mg | INTRAMUSCULAR | Status: AC | PRN
  Administered 2013-09-12: 4 mg via INTRAVENOUS
  Administered 2013-09-12: 2 mg via INTRAVENOUS
  Filled 2013-09-12: qty 2

## 2013-09-12 MED ORDER — METOPROLOL TARTRATE 1 MG/ML IV SOLN
2.5000 mg | INTRAVENOUS | Status: DC | PRN
  Administered 2013-09-14: 2.5 mg via INTRAVENOUS
  Filled 2013-09-12: qty 5

## 2013-09-12 MED ORDER — PROPOFOL 10 MG/ML IV BOLUS
INTRAVENOUS | Status: DC | PRN
  Administered 2013-09-12: 80 mg via INTRAVENOUS

## 2013-09-12 MED ORDER — PHENYLEPHRINE HCL 10 MG/ML IJ SOLN
0.0000 ug/min | INTRAVENOUS | Status: DC
  Administered 2013-09-13: 70 ug/min via INTRAVENOUS
  Administered 2013-09-13: 65 ug/min via INTRAVENOUS
  Administered 2013-09-13: 50 ug/min via INTRAVENOUS
  Filled 2013-09-12 (×6): qty 2

## 2013-09-12 MED ORDER — PANTOPRAZOLE SODIUM 40 MG PO TBEC
40.0000 mg | DELAYED_RELEASE_TABLET | Freq: Every day | ORAL | Status: DC
  Administered 2013-09-14 – 2013-09-20 (×7): 40 mg via ORAL
  Filled 2013-09-12 (×7): qty 1

## 2013-09-12 MED ORDER — SODIUM CHLORIDE 0.9 % IJ SOLN
INTRAMUSCULAR | Status: AC
  Filled 2013-09-12: qty 10

## 2013-09-12 MED ORDER — MIDAZOLAM HCL 5 MG/5ML IJ SOLN
INTRAMUSCULAR | Status: DC | PRN
  Administered 2013-09-12: 2 mg via INTRAVENOUS
  Administered 2013-09-12: 3 mg via INTRAVENOUS
  Administered 2013-09-12: 2 mg via INTRAVENOUS
  Administered 2013-09-12 (×2): 1 mg via INTRAVENOUS
  Administered 2013-09-12: 2 mg via INTRAVENOUS
  Administered 2013-09-12: 1 mg via INTRAVENOUS
  Administered 2013-09-12: 2 mg via INTRAVENOUS

## 2013-09-12 MED ORDER — DOCUSATE SODIUM 100 MG PO CAPS
200.0000 mg | ORAL_CAPSULE | Freq: Every day | ORAL | Status: DC
  Administered 2013-09-13 – 2013-09-20 (×8): 200 mg via ORAL
  Filled 2013-09-12 (×8): qty 2

## 2013-09-12 MED ORDER — STERILE WATER FOR INJECTION IJ SOLN
INTRAMUSCULAR | Status: AC
  Filled 2013-09-12: qty 10

## 2013-09-12 MED ORDER — MAGNESIUM SULFATE 40 MG/ML IJ SOLN
INTRAMUSCULAR | Status: AC
  Administered 2013-09-12: 4 g via INTRAVENOUS
  Filled 2013-09-12: qty 100

## 2013-09-12 MED ORDER — HEPARIN SODIUM (PORCINE) 1000 UNIT/ML IJ SOLN
INTRAMUSCULAR | Status: AC
  Filled 2013-09-12: qty 1

## 2013-09-12 MED ORDER — LACTATED RINGERS IV SOLN
500.0000 mL | Freq: Once | INTRAVENOUS | Status: AC | PRN
  Administered 2013-09-12: 500 mL via INTRAVENOUS

## 2013-09-12 MED ORDER — VECURONIUM BROMIDE 10 MG IV SOLR
INTRAVENOUS | Status: AC
  Filled 2013-09-12: qty 10

## 2013-09-12 MED ORDER — PROTAMINE SULFATE 10 MG/ML IV SOLN
INTRAVENOUS | Status: DC | PRN
  Administered 2013-09-12: 300 mg via INTRAVENOUS

## 2013-09-12 MED ORDER — PROPOFOL 10 MG/ML IV BOLUS
INTRAVENOUS | Status: AC
  Filled 2013-09-12: qty 20

## 2013-09-12 MED ORDER — ASPIRIN 81 MG PO CHEW: 324.0000 mg | CHEWABLE_TABLET | Freq: Every day | ORAL | Status: DC

## 2013-09-12 MED ORDER — METOPROLOL TARTRATE 25 MG/10 ML ORAL SUSPENSION
12.5000 mg | Freq: Two times a day (BID) | ORAL | Status: DC
  Filled 2013-09-12 (×3): qty 5

## 2013-09-12 MED ORDER — OXYCODONE HCL 5 MG PO TABS
5.0000 mg | ORAL_TABLET | ORAL | Status: DC | PRN
  Administered 2013-09-13 (×3): 10 mg via ORAL
  Administered 2013-09-14: 5 mg via ORAL
  Administered 2013-09-14: 10 mg via ORAL
  Filled 2013-09-12: qty 1
  Filled 2013-09-12 (×4): qty 2

## 2013-09-12 MED ORDER — ASPIRIN EC 325 MG PO TBEC
325.0000 mg | DELAYED_RELEASE_TABLET | Freq: Every day | ORAL | Status: DC
  Administered 2013-09-13 – 2013-09-16 (×4): 325 mg via ORAL
  Filled 2013-09-12 (×5): qty 1

## 2013-09-12 MED ORDER — ROCURONIUM BROMIDE 100 MG/10ML IV SOLN
INTRAVENOUS | Status: DC | PRN
  Administered 2013-09-12: 50 mg via INTRAVENOUS

## 2013-09-12 MED ORDER — SODIUM CHLORIDE 0.9 % IV SOLN
INTRAVENOUS | Status: DC
  Administered 2013-09-12: 15:00:00 via INTRAVENOUS

## 2013-09-12 MED ORDER — VECURONIUM BROMIDE 10 MG IV SOLR
INTRAVENOUS | Status: DC | PRN
  Administered 2013-09-12 (×4): 10 mg via INTRAVENOUS

## 2013-09-12 MED ORDER — AMINOCAPROIC ACID 250 MG/ML IV SOLN
0.5000 g/h | Freq: Once | INTRAVENOUS | Status: AC
  Administered 2013-09-12: 1 g via INTRAVENOUS
  Filled 2013-09-12: qty 20

## 2013-09-12 MED ORDER — SODIUM CHLORIDE 0.9 % IJ SOLN
3.0000 mL | Freq: Two times a day (BID) | INTRAMUSCULAR | Status: DC
  Administered 2013-09-13 – 2013-09-14 (×3): 3 mL via INTRAVENOUS
  Administered 2013-09-14: 10 mL via INTRAVENOUS
  Administered 2013-09-15 – 2013-09-19 (×5): 3 mL via INTRAVENOUS

## 2013-09-12 MED ORDER — ACETAMINOPHEN 160 MG/5ML PO SOLN: 1000.0000 mg | Freq: Four times a day (QID) | ORAL | Status: DC

## 2013-09-12 MED ORDER — MIDAZOLAM HCL 2 MG/2ML IJ SOLN
2.0000 mg | INTRAMUSCULAR | Status: DC | PRN
  Administered 2013-09-12: 2 mg via INTRAVENOUS
  Filled 2013-09-12: qty 2

## 2013-09-12 MED ORDER — HEPARIN SODIUM (PORCINE) 1000 UNIT/ML IJ SOLN
INTRAMUSCULAR | Status: DC | PRN
  Administered 2013-09-12: 36000 [IU] via INTRAVENOUS

## 2013-09-12 MED ORDER — DEXMEDETOMIDINE HCL IN NACL 200 MCG/50ML IV SOLN
0.1000 ug/kg/h | INTRAVENOUS | Status: DC
Start: 2013-09-12 — End: 2013-09-13
  Administered 2013-09-12: 0.7 ug/kg/h via INTRAVENOUS
  Filled 2013-09-12: qty 50

## 2013-09-12 MED ORDER — SUCCINYLCHOLINE CHLORIDE 20 MG/ML IJ SOLN
INTRAMUSCULAR | Status: AC
  Filled 2013-09-12: qty 1

## 2013-09-12 MED ORDER — MORPHINE SULFATE 2 MG/ML IJ SOLN
INTRAMUSCULAR | Status: AC
  Filled 2013-09-12: qty 1

## 2013-09-12 MED ORDER — MAGNESIUM SULFATE 4000MG/100ML IJ SOLN
4.0000 g | Freq: Once | INTRAMUSCULAR | Status: AC
  Administered 2013-09-12: 4 g via INTRAVENOUS

## 2013-09-12 MED ORDER — SODIUM CHLORIDE 0.45 % IV SOLN
INTRAVENOUS | Status: DC
  Administered 2013-09-12: 15:00:00 via INTRAVENOUS

## 2013-09-12 MED ORDER — SODIUM CHLORIDE 0.9 % IJ SOLN: 3.0000 mL | INTRAMUSCULAR | Status: DC | PRN

## 2013-09-12 MED ORDER — ALBUMIN HUMAN 5 % IV SOLN
250.0000 mL | INTRAVENOUS | Status: AC | PRN
  Administered 2013-09-12 (×4): 250 mL via INTRAVENOUS
  Filled 2013-09-12: qty 250

## 2013-09-12 MED ORDER — NITROGLYCERIN IN D5W 200-5 MCG/ML-% IV SOLN: 0.0000 ug/min | INTRAVENOUS | Status: DC

## 2013-09-12 MED ORDER — POTASSIUM CHLORIDE 10 MEQ/50ML IV SOLN
10.0000 meq | INTRAVENOUS | Status: AC
  Administered 2013-09-12 (×3): 10 meq via INTRAVENOUS

## 2013-09-12 MED ORDER — SODIUM CHLORIDE 0.9 % IV SOLN
INTRAVENOUS | Status: AC
  Filled 2013-09-12 (×2): qty 2.5

## 2013-09-12 MED ORDER — LEVOFLOXACIN IN D5W 750 MG/150ML IV SOLN
750.0000 mg | INTRAVENOUS | Status: AC
  Administered 2013-09-13: 750 mg via INTRAVENOUS
  Filled 2013-09-12: qty 150

## 2013-09-12 MED ORDER — ONDANSETRON HCL 4 MG/2ML IJ SOLN: 4.0000 mg | Freq: Four times a day (QID) | INTRAMUSCULAR | Status: DC | PRN

## 2013-09-12 MED ORDER — ACETAMINOPHEN 160 MG/5ML PO SOLN: 650.0000 mg | Freq: Once | ORAL | Status: AC

## 2013-09-12 MED ORDER — FAMOTIDINE IN NACL 20-0.9 MG/50ML-% IV SOLN
20.0000 mg | Freq: Two times a day (BID) | INTRAVENOUS | Status: DC
  Administered 2013-09-12: 20 mg via INTRAVENOUS

## 2013-09-12 MED ORDER — LACTATED RINGERS IV SOLN
INTRAVENOUS | Status: DC | PRN
  Administered 2013-09-12 (×2): via INTRAVENOUS

## 2013-09-12 MED ORDER — PROTAMINE SULFATE 10 MG/ML IV SOLN
INTRAVENOUS | Status: AC
  Filled 2013-09-12: qty 5

## 2013-09-12 MED FILL — Lidocaine HCl IV Inj 20 MG/ML: INTRAVENOUS | Qty: 5 | Status: AC

## 2013-09-12 MED FILL — Sodium Bicarbonate IV Soln 8.4%: INTRAVENOUS | Qty: 50 | Status: AC

## 2013-09-12 MED FILL — Mannitol IV Soln 20%: INTRAVENOUS | Qty: 500 | Status: AC

## 2013-09-12 MED FILL — Electrolyte-R (PH 7.4) Solution: INTRAVENOUS | Qty: 5000 | Status: AC

## 2013-09-12 MED FILL — Sodium Chloride IV Soln 0.9%: INTRAVENOUS | Qty: 2000 | Status: AC

## 2013-09-12 MED FILL — Heparin Sodium (Porcine) Inj 1000 Unit/ML: INTRAMUSCULAR | Qty: 20 | Status: AC

## 2013-09-12 SURGICAL SUPPLY — 130 items
ADAPTER CARDIO PERF ANTE/RETRO (ADAPTER) IMPLANT
APPLIER CLIP 9.375 MED OPEN (MISCELLANEOUS)
APPLIER CLIP 9.375 SM OPEN (CLIP)
ATTRACTOMAT 16X20 MAGNETIC DRP (DRAPES) ×3 IMPLANT
BAG DECANTER FOR FLEXI CONT (MISCELLANEOUS) ×3 IMPLANT
BANDAGE ELASTIC 4 VELCRO ST LF (GAUZE/BANDAGES/DRESSINGS) ×3 IMPLANT
BANDAGE ELASTIC 6 VELCRO ST LF (GAUZE/BANDAGES/DRESSINGS) ×3 IMPLANT
BASKET HEART (ORDER IN 25'S) (MISCELLANEOUS) ×1
BASKET HEART (ORDER IN 25S) (MISCELLANEOUS) ×2 IMPLANT
BENZOIN TINCTURE PRP APPL 2/3 (GAUZE/BANDAGES/DRESSINGS) ×3 IMPLANT
BLADE STERNUM SYSTEM 6 (BLADE) ×3 IMPLANT
BLADE SURG ROTATE 9660 (MISCELLANEOUS) IMPLANT
BNDG GAUZE ELAST 4 BULKY (GAUZE/BANDAGES/DRESSINGS) ×3 IMPLANT
CANISTER SUCTION 2500CC (MISCELLANEOUS) ×3 IMPLANT
CANNULA AORTIC ROOT 9FR (CANNULA) ×3 IMPLANT
CANNULA EZ GLIDE 8.0 24FR (CANNULA) ×3 IMPLANT
CANNULA EZ GLIDE AORTIC 21FR (CANNULA) ×6 IMPLANT
CANNULA GUNDRY RCSP 15FR (MISCELLANEOUS) IMPLANT
CANNULA VENOUS LOW PROF 34X46 (CANNULA) ×3 IMPLANT
CARDIAC SUCTION (MISCELLANEOUS) ×3 IMPLANT
CATH CPB KIT OWEN (MISCELLANEOUS) ×3 IMPLANT
CATH THORACIC 28FR (CATHETERS) IMPLANT
CATH THORACIC 28FR RT ANG (CATHETERS) IMPLANT
CATH THORACIC 36FR (CATHETERS) ×3 IMPLANT
CATH THORACIC 36FR RT ANG (CATHETERS) ×3 IMPLANT
CLIP APPLIE 9.375 MED OPEN (MISCELLANEOUS) IMPLANT
CLIP APPLIE 9.375 SM OPEN (CLIP) IMPLANT
CLIP FOGARTY SPRING 6M (CLIP) IMPLANT
CLIP RETRACTION 3.0MM CORONARY (MISCELLANEOUS) ×3 IMPLANT
CLIP TI MEDIUM 24 (CLIP) IMPLANT
CLIP TI WIDE RED SMALL 24 (CLIP) IMPLANT
CONN ST 1/4X3/8  BEN (MISCELLANEOUS) ×2
CONN ST 1/4X3/8 BEN (MISCELLANEOUS) ×4 IMPLANT
CONN Y 3/8X3/8X3/8  BEN (MISCELLANEOUS)
CONN Y 3/8X3/8X3/8 BEN (MISCELLANEOUS) IMPLANT
COVER MAYO STAND STRL (DRAPES) ×3 IMPLANT
COVER SURGICAL LIGHT HANDLE (MISCELLANEOUS) ×3 IMPLANT
CRADLE DONUT ADULT HEAD (MISCELLANEOUS) ×3 IMPLANT
DERMABOND ADVANCED (GAUZE/BANDAGES/DRESSINGS) ×1
DERMABOND ADVANCED .7 DNX12 (GAUZE/BANDAGES/DRESSINGS) ×2 IMPLANT
DRAIN CHANNEL 32F RND 10.7 FF (WOUND CARE) ×3 IMPLANT
DRAPE CARDIOVASCULAR INCISE (DRAPES) ×1
DRAPE INCISE IOBAN 66X45 STRL (DRAPES) ×3 IMPLANT
DRAPE SLUSH/WARMER DISC (DRAPES) ×3 IMPLANT
DRAPE SRG 135X102X78XABS (DRAPES) ×2 IMPLANT
DRSG AQUACEL AG ADV 3.5X14 (GAUZE/BANDAGES/DRESSINGS) ×3 IMPLANT
DRSG COVADERM 4X14 (GAUZE/BANDAGES/DRESSINGS) ×3 IMPLANT
ELECT BLADE 4.0 EZ CLEAN MEGAD (MISCELLANEOUS) ×3
ELECT REM PT RETURN 9FT ADLT (ELECTROSURGICAL) ×6
ELECTRODE BLDE 4.0 EZ CLN MEGD (MISCELLANEOUS) ×2 IMPLANT
ELECTRODE REM PT RTRN 9FT ADLT (ELECTROSURGICAL) ×4 IMPLANT
GAUZE SPONGE 4X4 12PLY STRL (GAUZE/BANDAGES/DRESSINGS) ×6 IMPLANT
GLOVE BIO SURGEON STRL SZ 6 (GLOVE) ×6 IMPLANT
GLOVE BIO SURGEON STRL SZ 6.5 (GLOVE) IMPLANT
GLOVE BIO SURGEON STRL SZ7 (GLOVE) ×9 IMPLANT
GLOVE BIO SURGEON STRL SZ7.5 (GLOVE) ×6 IMPLANT
GLOVE BIOGEL PI IND STRL 6 (GLOVE) IMPLANT
GLOVE BIOGEL PI IND STRL 6.5 (GLOVE) IMPLANT
GLOVE BIOGEL PI IND STRL 7.0 (GLOVE) ×8 IMPLANT
GLOVE BIOGEL PI INDICATOR 6 (GLOVE)
GLOVE BIOGEL PI INDICATOR 6.5 (GLOVE)
GLOVE BIOGEL PI INDICATOR 7.0 (GLOVE) ×4
GLOVE EUDERMIC 7 POWDERFREE (GLOVE) IMPLANT
GLOVE ORTHO TXT STRL SZ7.5 (GLOVE) ×6 IMPLANT
GOWN STRL REUS W/ TWL LRG LVL3 (GOWN DISPOSABLE) ×20 IMPLANT
GOWN STRL REUS W/TWL LRG LVL3 (GOWN DISPOSABLE) ×10
HEMOSTAT POWDER SURGIFOAM 1G (HEMOSTASIS) ×15 IMPLANT
INSERT FOGARTY 61MM (MISCELLANEOUS) IMPLANT
INSERT FOGARTY XLG (MISCELLANEOUS) ×3 IMPLANT
KIT BASIN OR (CUSTOM PROCEDURE TRAY) ×3 IMPLANT
KIT ROOM TURNOVER OR (KITS) ×3 IMPLANT
KIT SUCTION CATH 14FR (SUCTIONS) ×15 IMPLANT
KIT VASOVIEW W/TROCAR VH 2000 (KITS) ×3 IMPLANT
LEAD PACING MYOCARDI (MISCELLANEOUS) ×3 IMPLANT
MARKER GRAFT CORONARY BYPASS (MISCELLANEOUS) ×9 IMPLANT
MARKER SKIN DUAL TIP RULER LAB (MISCELLANEOUS) ×3 IMPLANT
NS IRRIG 1000ML POUR BTL (IV SOLUTION) ×15 IMPLANT
PACK OPEN HEART (CUSTOM PROCEDURE TRAY) ×3 IMPLANT
PAD ARMBOARD 7.5X6 YLW CONV (MISCELLANEOUS) ×3 IMPLANT
PAD ELECT DEFIB RADIOL ZOLL (MISCELLANEOUS) ×3 IMPLANT
PENCIL BUTTON HOLSTER BLD 10FT (ELECTRODE) ×3 IMPLANT
PUNCH AORTIC ROTATE 4.0MM (MISCELLANEOUS) IMPLANT
PUNCH AORTIC ROTATE 4.5MM 8IN (MISCELLANEOUS) ×3 IMPLANT
PUNCH AORTIC ROTATE 5MM 8IN (MISCELLANEOUS) IMPLANT
SET CARDIOPLEGIA MPS 5001102 (MISCELLANEOUS) ×3 IMPLANT
SOLUTION ANTI FOG 6CC (MISCELLANEOUS) IMPLANT
SPONGE GAUZE 4X4 12PLY STER LF (GAUZE/BANDAGES/DRESSINGS) ×6 IMPLANT
SPONGE LAP 18X18 X RAY DECT (DISPOSABLE) ×9 IMPLANT
SPONGE LAP 4X18 X RAY DECT (DISPOSABLE) ×3 IMPLANT
SUT BONE WAX W31G (SUTURE) ×3 IMPLANT
SUT ETHIBOND X763 2 0 SH 1 (SUTURE) ×6 IMPLANT
SUT MNCRL AB 3-0 PS2 18 (SUTURE) ×6 IMPLANT
SUT MNCRL AB 4-0 PS2 18 (SUTURE) IMPLANT
SUT PDS AB 1 CTX 36 (SUTURE) ×6 IMPLANT
SUT PROLENE 2 0 SH DA (SUTURE) IMPLANT
SUT PROLENE 3 0 SH DA (SUTURE) ×3 IMPLANT
SUT PROLENE 3 0 SH1 36 (SUTURE) IMPLANT
SUT PROLENE 4 0 RB 1 (SUTURE) ×2
SUT PROLENE 4 0 SH DA (SUTURE) IMPLANT
SUT PROLENE 4-0 RB1 .5 CRCL 36 (SUTURE) ×4 IMPLANT
SUT PROLENE 5 0 C 1 36 (SUTURE) IMPLANT
SUT PROLENE 6 0 C 1 30 (SUTURE) ×12 IMPLANT
SUT PROLENE 7.0 RB 3 (SUTURE) ×9 IMPLANT
SUT PROLENE 8 0 BV175 6 (SUTURE) ×15 IMPLANT
SUT PROLENE BLUE 7 0 (SUTURE) ×9 IMPLANT
SUT PROLENE POLY MONO (SUTURE) ×3 IMPLANT
SUT SILK  1 MH (SUTURE) ×1
SUT SILK 1 MH (SUTURE) ×2 IMPLANT
SUT STEEL 6MS V (SUTURE) IMPLANT
SUT STEEL STERNAL CCS#1 18IN (SUTURE) IMPLANT
SUT STEEL SZ 6 DBL 3X14 BALL (SUTURE) IMPLANT
SUT VIC AB 1 CTX 36 (SUTURE)
SUT VIC AB 1 CTX36XBRD ANBCTR (SUTURE) IMPLANT
SUT VIC AB 2-0 CT1 27 (SUTURE) ×1
SUT VIC AB 2-0 CT1 TAPERPNT 27 (SUTURE) ×2 IMPLANT
SUT VIC AB 2-0 CTX 27 (SUTURE) ×3 IMPLANT
SUT VIC AB 3-0 SH 27 (SUTURE)
SUT VIC AB 3-0 SH 27X BRD (SUTURE) IMPLANT
SUT VIC AB 3-0 X1 27 (SUTURE) ×3 IMPLANT
SUT VICRYL 4-0 PS2 18IN ABS (SUTURE) IMPLANT
SUTURE E-PAK OPEN HEART (SUTURE) ×3 IMPLANT
SYSTEM SAHARA CHEST DRAIN ATS (WOUND CARE) ×3 IMPLANT
TAPE CLOTH SURG 4X10 WHT LF (GAUZE/BANDAGES/DRESSINGS) ×3 IMPLANT
TAPE PAPER 2X10 WHT MICROPORE (GAUZE/BANDAGES/DRESSINGS) ×3 IMPLANT
TOWEL OR 17X24 6PK STRL BLUE (TOWEL DISPOSABLE) ×9 IMPLANT
TOWEL OR 17X26 10 PK STRL BLUE (TOWEL DISPOSABLE) ×9 IMPLANT
TRAY FOLEY IC TEMP SENS 16FR (CATHETERS) ×3 IMPLANT
TUBING INSUFFLATION 10FT LAP (TUBING) ×3 IMPLANT
UNDERPAD 30X30 INCONTINENT (UNDERPADS AND DIAPERS) ×3 IMPLANT
WATER STERILE IRR 1000ML POUR (IV SOLUTION) ×6 IMPLANT

## 2013-09-12 NOTE — OR Nursing (Signed)
First call made to SICU 

## 2013-09-12 NOTE — Op Note (Addendum)
CARDIOTHORACIC SURGERY OPERATIVE NOTE  Date of Procedure: 09/12/2013  Preoperative Diagnosis: Severe 3-vessel Coronary Artery Disease  Postoperative Diagnosis: Same  Procedure:    Coronary Artery Bypass Grafting x 5   Left Internal Mammary Artery to Distal Left Anterior Descending Coronary Artery  Saphenous Vein Graft to the Posterior Descending Coronary Artery and  Sequential Saphenous Vein Graft to the Right Posterolateral Branch Coronary Artery  Saphenous Vein Graft to the Ramus Intermediate Branch and   Sequential Saphenous Vein Graft to the First Obtuse Marginal Branch of Left Circumflex Coronary Artery  Endoscopic Vein Harvest from Right Thigh and Lower Leg   Banding of Proximal Ascending Thoracic Aorta   Surgeon: Salvatore Decent. Cornelius Moras, MD  Assistant: Rowe Clack, PA-C  Anesthesia: Corky Sox, MD  Operative Findings: Normal LV systolic function  Mild LVH with diastolic dysfunction  Mild dilatation of the ascending aorta w/ maximum transverse diameter approximately 4.0 cm  Good quality LIMA and SVG conduit for grafting  Good quality target vessels for grafting    BRIEF CLINICAL NOTE AND INDICATIONS FOR SURGERY  Patient is a 63 year old obese white male with known history of coronary artery disease, hypertension, hyperlipidemia, type 2 diabetes mellitus, and recently discovered thoracic aortic aneurysm who has been referred for possible surgical treatment of coronary artery disease. The patient's cardiac history dates back to 1999 when he was found to have single-vessel coronary artery disease by catheterization. He was treated medically. The patient lives a somewhat sedentary lifestyle but was otherwise in his usual state of health until May of this year when he developed severe septic shock following dental extraction of an infected tooth. He was hospitalized at [facility] in [place] for a total of 4 days and recovered. During that hospitalization he was found to  have an aneurysm of the ascending thoracic aorta which the patient was told measured 4.9 cm in diameter. He was evaluated by cardiac surgeon and told that surgery was not indicated at this time. The patient was also referred to a cardiologist and apparently underwent a nuclear stress test that reportedly demonstrated resting ejection fraction 64% and no signs of significant ischemia or previous myocardial infarction. However, over the past several months the patient has experienced persistent and progressive symptoms of exertional chest discomfort and shortness of breath. Symptoms have increased in frequency and severity, and recently have been occurring intermittently at rest and with minimal activity. Symptoms are releived by rest or administration of sublingual nitroglycerin. The patient has had nocturnal angina that has awoken from his sleep. He denies any episodes of prolonged chest pain lasting more than 10-15 minutes. He states that he does not get short of breath unless he is having chest discomfort, but recently he has gotten to the point where he can not do much of any physical activity. He has had some dizzy spells without syncope. He has not had palpitations. He denies any history PND, orthopnea, or lower extremity edema. He was evaluated by Dr. Rennis Golden on 08/29/2013 and scheduled for elective cardiac catheterization. Cardiac catheterization demonstrates critical three-vessel coronary artery disease with preserved left ventricular function. Cardiothoracic surgical consultation was requested.  CT angiogram of the chest reveals mild aneurysmal dilatation of the ascending thoracic aorta.    DETAILS OF THE OPERATIVE PROCEDURE  Preparation:  The patient is brought to the operating room on the above mentioned date and central monitoring was established by the anesthesia team including placement of Swan-Ganz catheter and radial arterial line. The patient is placed in the supine  position on the operating  table.  Intravenous antibiotics are administered. General endotracheal anesthesia is induced uneventfully. A Foley catheter is placed.  Baseline transesophageal echocardiogram was performed.  Findings were notable for normal LV systolic function, mild LVH with diastolic dysfunction and mild dilatation of the ascending aorta with maximum transverse diameter < 4.5 cm. The aortic valve appeared normal with trace central aortic insufficiency.  The patient's chest, abdomen, both groins, and both lower extremities are prepared and draped in a sterile manner. A time out procedure is performed.   Surgical Approach and Conduit Harvest:  A median sternotomy incision was performed and the left internal mammary artery is dissected from the chest wall and prepared for bypass grafting. The left internal mammary artery is notably good quality conduit. Simultaneously, saphenous vein is obtained from the patient's right thigh and leg using endoscopic vein harvest technique. The saphenous vein is notably good quality conduit. After removal of the saphenous vein, the small surgical incisions in the lower extremity are closed with absorbable suture. Following systemic heparinization, the left internal mammary artery was transected distally noted to have excellent flow.   Extracorporeal Cardiopulmonary Bypass and Myocardial Protection:  The pericardium is opened. The ascending aorta is mildly dilated in appearance. The ascending aorta and the right atrium are cannulated for cardioplegia bypass.  Adequate heparinization is verified.     The entire pre-bypass portion of the operation was notable for stable hemodynamics.  Cardiopulmonary bypass was begun and the ascending aorta is dissected away from the pulmonary artery.  A silk suture is passed around the aorta and the maximum circumference is measured 12.7 cm corresponding to a maximum transverse diameter of 4.0 cm.  The surface of the heart is inspected. Distal  target vessels are selected for coronary artery bypass grafting. A cardioplegia cannula is placed in the ascending aorta.  A temperature probe was placed in the interventricular septum.  The patient is allowed to cool passively to [facility] systemic temperature.  The aortic cross clamp is applied and cold blood cardioplegia is delivered initially in an antegrade fashion through the aortic root.   Iced saline slush is applied for topical hypothermia.  The initial cardioplegic arrest is rapid with early diastolic arrest.  Repeat doses of cardioplegia are administered intermittently throughout the entire cross clamp portion of the operation through the aortic root and through subsequently placed vein grafts in order to maintain completely flat electrocardiogram and septal myocardial temperature below 15C.  Myocardial protection was felt to be excellent.  Coronary Artery Bypass Grafting:   The posterior descending branch of the right coronary artery was grafted using a reversed saphenous vein graft in an side-to-side fashion.  At the site of distal anastomosis the target vessel was good quality and measured approximately 1.5 mm in diameter.  The posterolateral branch of the distal right coronary artery was grafted using a sequential saphenous vein graft in an side-to-side fashion off of the distal portion of the vein graft placed to the posterior descending coronary artery.  At the site of distal anastomosis the target vessel was good quality and measured approximately 2.0 mm in diameter.  The ramus intermediate branch coronary artery was grafted using a reversed saphenous vein graft in an side-to-side fashion.  At the site of distal anastomosis the target vessel was good quality and measured approximately 1.5 mm in diameter.  The first obtuse marginal branch of the left circumflex coronary artery was grafted using a sequential saphenous vein graft in an end-to-side fashion off of  the distal portion of the vein  graft placed to the intermediate branch.  At the site of distal anastomosis the target vessel was fair quality and measured approximately 1.3 mm in diameter.  The distal left anterior coronary artery was grafted with the left internal mammary artery in an end-to-side fashion.  At the site of distal anastomosis the target vessel was good quality and measured approximately 1.8 mm in diameter.   Banding of Proximal Ascending Thoracic Aorta   A Teflon felt strip was passed around the proximal ascending thoracic aorta and sewn in place using horizontal mattress running 3-0 Prolene suture.  The band was placed immediately above the level of the sinotubular junction.  It was placed snug to prevent subsequent dilatation but no significant plication was attempted.   Procedure Completion:  All proximal vein graft anastomoses were placed directly to the ascending aorta prior to removal of the aortic cross clamp.  The septal myocardial temperature rose rapidly after reperfusion of the left internal mammary artery graft.  The aortic cross clamp was removed after a total cross clamp time of 90 minutes.  All proximal and distal coronary anastomoses were inspected for hemostasis and appropriate graft orientation.  Some bleeding was noted from an avulsed side branch of the left internal mammary pedicle immediately proximal to the distal anastomosis with the left anterior descending coronary artery.  Due to the relatively small caliber of the LIMA, a decision is made to redo the distal anastomosis rather than attempt direct repair.  A cardioplegia cannula is replaced in the ascending aorta and another arresting dose of cold blood cardioplegia is administered.  The LIMA distal anastomosis is taken down and redone after amputation of the LIMA just above the level of the side branch.  The septal myocardial temperature rose rapidly after reperfusion of the left internal mammary artery graft.  The aortic cross clamp was  removed after an additional clamp time of 22 minutes such that the grand total for the operation was 112 minutes.  Epicardial pacing wires are fixed to the right ventricular outflow tract and to the right atrial appendage. The patient is rewarmed to 37C temperature. The patient is weaned and disconnected from cardiopulmonary bypass.  The patient's rhythm at separation from bypass was AV paced.  The patient was weaned from cardioplegic bypass without any inotropic support. Total cardiopulmonary bypass time for the operation was 169 minutes.  Followup transesophageal echocardiogram performed after separation from bypass revealed no changes from the preoperative exam.  The aortic and venous cannula were removed uneventfully. Protamine was administered to reverse the anticoagulation. The mediastinum and pleural space were inspected for hemostasis and irrigated with saline solution. The mediastinum and the left pleural space were drained using 3 chest tubes placed through separate stab incisions inferiorly.  The soft tissues anterior to the aorta were reapproximated loosely. The sternum is closed with double strength sternal wire. The soft tissues anterior to the sternum were closed in multiple layers and the skin is closed with a running subcuticular skin closure.  The post-bypass portion of the operation was notable for stable rhythm and hemodynamics.  No blood products were administered during the operation.   Disposition:  The patient tolerated the procedure well and is transported to the surgical intensive care in stable condition. There are no intraoperative complications. All sponge instrument and needle counts are verified correct at completion of the operation.    Salvatore Decent. Cornelius Moras MD 09/12/2013 2:35 PM

## 2013-09-12 NOTE — Transfer of Care (Signed)
Immediate Anesthesia Transfer of Care Note  Patient: Frank Moreno.  Procedure(s) Performed: Procedure(s) with comments: CORONARY ARTERY BYPASS GRAFTING (CABG), on pump, times five, using left internal mammary artery, right greater saphenous vein hervested endoscopically. (N/A) - LIMA-LAD; SEQ SVG-INTERM.-OM1; SEQ SVG-PD-PL EVH RIGHT LEG INTRAOPERATIVE TRANSESOPHAGEAL ECHOCARDIOGRAM (N/A)  Patient Location: SICU  Anesthesia Type:General  Level of Consciousness: Patient remains intubated per anesthesia plan  Airway & Oxygen Therapy: Patient remains intubated per anesthesia plan and Patient placed on Ventilator (see vital sign flow sheet for setting)  Post-op Assessment: Post -op Vital signs reviewed and stable  Post vital signs: Reviewed and stable  Complications: No apparent anesthesia complications

## 2013-09-12 NOTE — Brief Op Note (Addendum)
      [address]       [place] [address]             [phone]     09/11/2013 - 09/12/2013  12:03 PM  PATIENT:  Frank Moreno.  63 y.o. male  PRE-OPERATIVE DIAGNOSIS:  CAD  POST-OPERATIVE DIAGNOSIS:  Coronary Artery Disease  PROCEDURE:  Procedure(s): CORONARY ARTERY BYPASS GRAFTING (CABG),X5 LIMA-LAD; SEQ SVG-INTERM.-OM1; SEQ SVG-PD-PL EVH RIGHT LEG INTRAOPERATIVE TRANSESOPHAGEAL ECHOCARDIOGRAM  SURGEON:    Purcell Nails, MD  ASSISTANTS:  Rowe Clack, PA-C  ANESTHESIA:   Corky Sox, MD  CROSSCLAMP TIME:   112'  CARDIOPULMONARY BYPASS TIME: 169'  FINDINGS:  Normal LV systolic function  Mild LVH with diastolic dysfunction  Mild dilatation of the ascending aorta w/ maximum transverse diameter 4.0 cm  Good quality LIMA and SVG conduit for grafting  Good quality target vessels for grafting  COMPLICATIONS: None  BASELINE WEIGHT: 121 kg  PATIENT DISPOSITION:   TO SICU IN STABLE CONDITION  Alyha Marines H 09/12/2013 2:31 PM

## 2013-09-12 NOTE — Anesthesia Preprocedure Evaluation (Addendum)
Anesthesia Evaluation  Patient identified by MRN, date of birth, ID band Patient awake    Reviewed: Allergy & Precautions, H&P , NPO status , Patient's Chart, lab work & pertinent test results, reviewed documented beta blocker date and time   History of Anesthesia Complications Negative for: history of anesthetic complications  Airway Mallampati: III TM Distance: <3 FB Neck ROM: Full    Dental  (+) Dental Advisory Given, Teeth Intact   Pulmonary neg pulmonary ROS,  breath sounds clear to auscultation        Cardiovascular hypertension, Pt. on medications and Pt. on home beta blockers + angina at rest + CAD and + Peripheral Vascular Disease - Past MI and - CHF - dysrhythmias - Valvular Problems/MurmursRhythm:Regular  Ascending aorta dilatation (4.6cm)   Neuro/Psych negative neurological ROS  negative psych ROS   GI/Hepatic negative GI ROS, Neg liver ROS,   Endo/Other  diabetes, Type 2, Oral Hypoglycemic AgentsMorbid obesity  Renal/GU negative Renal ROS     Musculoskeletal  (+) Arthritis -,   Abdominal   Peds  Hematology   Anesthesia Other Findings   Reproductive/Obstetrics                         Anesthesia Physical Anesthesia Plan  ASA: IV  Anesthesia Plan: General   Post-op Pain Management:    Induction: Intravenous  Airway Management Planned: Oral ETT  Additional Equipment: Arterial line, TEE, CVP, PA Cath and Ultrasound Guidance Line Placement  Intra-op Plan:   Post-operative Plan: Extubation in OR  Informed Consent: I have reviewed the patients History and Physical, chart, labs and discussed the procedure including the risks, benefits and alternatives for the proposed anesthesia with the patient or authorized representative who has indicated his/her understanding and acceptance.   Dental advisory given  Plan Discussed with: CRNA and Surgeon  Anesthesia Plan Comments:          Anesthesia Quick Evaluation

## 2013-09-12 NOTE — Progress Notes (Signed)
[facility] BRIEF SICU PROGRESS NOTE  Day of Surgery  S/P Procedure(s) (LRB): CORONARY ARTERY BYPASS GRAFTING (CABG), on pump, times five, using left internal mammary artery, right greater saphenous vein hervested endoscopically. (N/A) INTRAOPERATIVE TRANSESOPHAGEAL ECHOCARDIOGRAM (N/A)   Sedated on vent NSR, AAI paced for HR Stable hemodynamics although PA pressures low and C.I. 1.5 Chest tube output low UOP > 100 mL/hr Labs okay w/ Hgb 14  Plan: Needs volume.  Continue routine early postop.  OWEN,CLARENCE H 09/12/2013 5:44 PM

## 2013-09-12 NOTE — Progress Notes (Signed)
  Echocardiogram Echocardiogram Transesophageal has been performed.  Georgian Co 09/12/2013, 8:42 AM

## 2013-09-12 NOTE — Anesthesia Postprocedure Evaluation (Signed)
  Anesthesia Post-op Note  Patient: Frank Moreno.  Procedure(s) Performed: Procedure(s) with comments: CORONARY ARTERY BYPASS GRAFTING (CABG), on pump, times five, using left internal mammary artery, right greater saphenous vein hervested endoscopically. (N/A) - LIMA-LAD; SEQ SVG-INTERM.-OM1; SEQ SVG-PD-PL EVH RIGHT LEG INTRAOPERATIVE TRANSESOPHAGEAL ECHOCARDIOGRAM (N/A)  Patient Location: ICU  Anesthesia Type:General  Level of Consciousness: sedated  Airway and Oxygen Therapy: Patient remains intubated per anesthesia plan  Post-op Pain: none  Post-op Assessment: Post-op Vital signs reviewed, Patient's Cardiovascular Status Stable, Respiratory Function Stable and Patent Airway  Post-op Vital Signs: Reviewed and stable  Last Vitals:  Filed Vitals:   09/12/13 1615  BP:   Pulse: 88  Temp: 36.5 C  Resp: 21    Complications: No apparent anesthesia complications

## 2013-09-12 NOTE — Anesthesia Procedure Notes (Signed)
Procedures

## 2013-09-12 NOTE — Progress Notes (Signed)
Utilization review completed. Isabel Ardila, RN, BSN. 

## 2013-09-12 NOTE — Procedures (Signed)
Extubation Procedure Note  Patient Details:   Name: Frank Moreno. DOB: [birth year] MRN: [id number]   Airway Documentation:  Pre extubation: Pt follows all commands, completed Rapid Wean Protocol without complication. NIF -30, VC . ABG within normal range. Positive cuff leak Post Extubation: Placed on 2L [place]/ Sats 96%. No stridor. Able to speak name/location. IS 500-750. Pt tolerating extubation well.  Evaluation  O2 sats: stable throughout Complications: No apparent complications Patient did tolerate procedure well. Bilateral Breath Sounds: Clear Suctioning: Airway;Oral Yes  Elmer Picker 09/12/2013, 8:23 PM

## 2013-09-12 NOTE — Progress Notes (Signed)
Dr. Cornelius Moras at bedside. States to give remaining 2 Albumin. Proceed to wake up patient.

## 2013-09-13 ENCOUNTER — Encounter (HOSPITAL_COMMUNITY): Payer: Self-pay | Admitting: Thoracic Surgery (Cardiothoracic Vascular Surgery)

## 2013-09-13 ENCOUNTER — Inpatient Hospital Stay (HOSPITAL_COMMUNITY): Payer: BC Managed Care – PPO

## 2013-09-13 DIAGNOSIS — I4891 Unspecified atrial fibrillation: Secondary | ICD-10-CM | POA: Diagnosis not present

## 2013-09-13 DIAGNOSIS — Z01818 Encounter for other preprocedural examination: Secondary | ICD-10-CM

## 2013-09-13 LAB — POCT I-STAT, CHEM 8
BUN: 15 mg/dL (ref 6–23)
Calcium, Ion: 1.08 mmol/L — ABNORMAL LOW (ref 1.13–1.30)
Chloride: 106 mEq/L (ref 96–112)
Creatinine, Ser: 1.1 mg/dL (ref 0.50–1.35)
GLUCOSE: 183 mg/dL — AB (ref 70–99)
HCT: 35 % — ABNORMAL LOW (ref 39.0–52.0)
HEMOGLOBIN: 11.9 g/dL — AB (ref 13.0–17.0)
Potassium: 4.3 mEq/L (ref 3.7–5.3)
Sodium: 137 mEq/L (ref 137–147)
TCO2: 20 mmol/L (ref 0–100)

## 2013-09-13 LAB — CREATININE, SERUM
Creatinine, Ser: 1.1 mg/dL (ref 0.50–1.35)
GFR calc Af Amer: 81 mL/min — ABNORMAL LOW (ref >90)
GFR calc non Af Amer: 70 mL/min — ABNORMAL LOW (ref >90)

## 2013-09-13 LAB — CBC
HCT: 35.9 % — ABNORMAL LOW (ref 39.0–52.0)
HEMATOCRIT: 33.6 % — AB (ref 39.0–52.0)
Hemoglobin: 12.5 g/dL — ABNORMAL LOW (ref 13.0–17.0)
Hemoglobin: 11.5 g/dL — ABNORMAL LOW (ref 13.0–17.0)
MCH: 30.5 pg (ref 26.0–34.0)
MCH: 30.2 pg (ref 26.0–34.0)
MCHC: 34.8 g/dL (ref 30.0–36.0)
MCHC: 34.2 g/dL (ref 30.0–36.0)
MCV: 87.6 fL (ref 78.0–100.0)
MCV: 88.2 fL (ref 78.0–100.0)
PLATELETS: 138 10*3/uL — AB (ref 150–400)
Platelets: 136 10*3/uL — ABNORMAL LOW (ref 150–400)
RBC: 4.1 MIL/uL — ABNORMAL LOW (ref 4.22–5.81)
RBC: 3.81 MIL/uL — AB (ref 4.22–5.81)
RDW: 14.5 % (ref 11.5–15.5)
RDW: 15.3 % (ref 11.5–15.5)
WBC: 15.5 10*3/uL — ABNORMAL HIGH (ref 4.0–10.5)
WBC: 16.3 10*3/uL — ABNORMAL HIGH (ref 4.0–10.5)

## 2013-09-13 LAB — BLOOD GAS, ARTERIAL
Acid-base deficit: 0.7 mmol/L (ref 0.0–2.0)
BICARBONATE: 23.9 meq/L (ref 20.0–24.0)
Drawn by: 41308
FIO2: 100 %
O2 Saturation: 99.7 %
PATIENT TEMPERATURE: 98.6
TCO2: 25.2 mmol/L (ref 0–100)
pCO2 arterial: 42.5 mmHg (ref 35.0–45.0)
pH, Arterial: 7.369 (ref 7.350–7.450)
pO2, Arterial: 268 mmHg — ABNORMAL HIGH (ref 80.0–100.0)

## 2013-09-13 LAB — POCT I-STAT 4, (NA,K, GLUC, HGB,HCT)
Glucose, Bld: 172 mg/dL — ABNORMAL HIGH (ref 70–99)
HEMATOCRIT: 36 % — AB (ref 39.0–52.0)
HEMOGLOBIN: 12.2 g/dL — AB (ref 13.0–17.0)
Potassium: 4.2 mEq/L (ref 3.7–5.3)
Sodium: 140 mEq/L (ref 137–147)

## 2013-09-13 LAB — BASIC METABOLIC PANEL
Anion gap: 10 (ref 5–15)
BUN: 12 mg/dL (ref 6–23)
CALCIUM: 7.5 mg/dL — AB (ref 8.4–10.5)
CO2: 22 mEq/L (ref 19–32)
CREATININE: 0.9 mg/dL (ref 0.50–1.35)
Chloride: 108 mEq/L (ref 96–112)
GFR calc Af Amer: >90 mL/min (ref >90)
GFR calc non Af Amer: 89 mL/min — ABNORMAL LOW (ref >90)
Glucose, Bld: 140 mg/dL — ABNORMAL HIGH (ref 70–99)
Potassium: 4.3 mEq/L (ref 3.7–5.3)
SODIUM: 140 meq/L (ref 137–147)

## 2013-09-13 LAB — POCT I-STAT 3, ART BLOOD GAS (G3+)
ACID-BASE DEFICIT: 5 mmol/L — AB (ref 0.0–2.0)
Acid-base deficit: 5 mmol/L — ABNORMAL HIGH (ref 0.0–2.0)
BICARBONATE: 21.5 meq/L (ref 20.0–24.0)
Bicarbonate: 21.2 mEq/L (ref 20.0–24.0)
O2 SAT: 90 %
O2 Saturation: 91 %
PCO2 ART: 45.3 mmHg — AB (ref 35.0–45.0)
PCO2 ART: 40.1 mmHg (ref 35.0–45.0)
PO2 ART: 68 mmHg — AB (ref 80.0–100.0)
PO2 ART: 63 mmHg — AB (ref 80.0–100.0)
Patient temperature: 37.6
Patient temperature: 36.3
TCO2: 23 mmol/L (ref 0–100)
TCO2: 22 mmol/L (ref 0–100)
pH, Arterial: 7.288 — ABNORMAL LOW (ref 7.350–7.450)
pH, Arterial: 7.328 — ABNORMAL LOW (ref 7.350–7.450)

## 2013-09-13 LAB — GLUCOSE, CAPILLARY
GLUCOSE-CAPILLARY: 49 mg/dL — AB (ref 70–99)
GLUCOSE-CAPILLARY: 111 mg/dL — AB (ref 70–99)
GLUCOSE-CAPILLARY: 121 mg/dL — AB (ref 70–99)
GLUCOSE-CAPILLARY: 137 mg/dL — AB (ref 70–99)
GLUCOSE-CAPILLARY: 120 mg/dL — AB (ref 70–99)
GLUCOSE-CAPILLARY: 114 mg/dL — AB (ref 70–99)
GLUCOSE-CAPILLARY: 130 mg/dL — AB (ref 70–99)
GLUCOSE-CAPILLARY: 184 mg/dL — AB (ref 70–99)
GLUCOSE-CAPILLARY: 149 mg/dL — AB (ref 70–99)
Glucose-Capillary: 119 mg/dL — ABNORMAL HIGH (ref 70–99)
Glucose-Capillary: 122 mg/dL — ABNORMAL HIGH (ref 70–99)
Glucose-Capillary: 126 mg/dL — ABNORMAL HIGH (ref 70–99)
Glucose-Capillary: 126 mg/dL — ABNORMAL HIGH (ref 70–99)
Glucose-Capillary: 126 mg/dL — ABNORMAL HIGH (ref 70–99)
Glucose-Capillary: 127 mg/dL — ABNORMAL HIGH (ref 70–99)
Glucose-Capillary: 128 mg/dL — ABNORMAL HIGH (ref 70–99)
Glucose-Capillary: 130 mg/dL — ABNORMAL HIGH (ref 70–99)
Glucose-Capillary: 133 mg/dL — ABNORMAL HIGH (ref 70–99)
Glucose-Capillary: 139 mg/dL — ABNORMAL HIGH (ref 70–99)
Glucose-Capillary: 139 mg/dL — ABNORMAL HIGH (ref 70–99)
Glucose-Capillary: 141 mg/dL — ABNORMAL HIGH (ref 70–99)
Glucose-Capillary: 141 mg/dL — ABNORMAL HIGH (ref 70–99)
Glucose-Capillary: 81 mg/dL (ref 70–99)

## 2013-09-13 LAB — MAGNESIUM
MAGNESIUM: 2.3 mg/dL (ref 1.5–2.5)
Magnesium: 2.3 mg/dL (ref 1.5–2.5)

## 2013-09-13 MED ORDER — SODIUM CHLORIDE 0.9 % IV SOLN: INTRAVENOUS | Status: DC

## 2013-09-13 MED ORDER — AMIODARONE HCL IN DEXTROSE 360-4.14 MG/200ML-% IV SOLN
INTRAVENOUS | Status: AC
  Administered 2013-09-13: 60 mg/h via INTRAVENOUS
  Filled 2013-09-13: qty 200

## 2013-09-13 MED ORDER — ALBUMIN HUMAN 5 % IV SOLN
12.5000 g | Freq: Once | INTRAVENOUS | Status: AC
  Administered 2013-09-13: 12.5 g via INTRAVENOUS

## 2013-09-13 MED ORDER — CETYLPYRIDINIUM CHLORIDE 0.05 % MT LIQD
7.0000 mL | Freq: Two times a day (BID) | OROMUCOSAL | Status: DC
  Administered 2013-09-13 – 2013-09-18 (×9): 7 mL via OROMUCOSAL

## 2013-09-13 MED ORDER — INSULIN ASPART 100 UNIT/ML ~~LOC~~ SOLN
0.0000 [IU] | SUBCUTANEOUS | Status: DC
  Administered 2013-09-13 (×2): 2 [IU] via SUBCUTANEOUS
  Administered 2013-09-13: 8 [IU] via SUBCUTANEOUS
  Administered 2013-09-14: 2 [IU] via SUBCUTANEOUS

## 2013-09-13 MED ORDER — METOPROLOL TARTRATE 12.5 MG HALF TABLET
12.5000 mg | ORAL_TABLET | Freq: Two times a day (BID) | ORAL | Status: DC
  Administered 2013-09-14 – 2013-09-16 (×4): 12.5 mg via ORAL
  Filled 2013-09-13 (×9): qty 1

## 2013-09-13 MED ORDER — METOPROLOL TARTRATE 25 MG PO TABS: 25.0000 mg | ORAL_TABLET | Freq: Two times a day (BID) | ORAL | Status: DC

## 2013-09-13 MED ORDER — AMIODARONE HCL IN DEXTROSE 360-4.14 MG/200ML-% IV SOLN
60.0000 mg/h | INTRAVENOUS | Status: AC
  Administered 2013-09-13 (×2): 60 mg/h via INTRAVENOUS
  Filled 2013-09-13: qty 200

## 2013-09-13 MED ORDER — AMIODARONE LOAD VIA INFUSION
150.0000 mg | Freq: Once | INTRAVENOUS | Status: AC
  Administered 2013-09-13: 150 mg via INTRAVENOUS

## 2013-09-13 MED ORDER — INSULIN DETEMIR 100 UNIT/ML ~~LOC~~ SOLN
20.0000 [IU] | Freq: Two times a day (BID) | SUBCUTANEOUS | Status: DC
  Administered 2013-09-13 – 2013-09-16 (×8): 20 [IU] via SUBCUTANEOUS
  Filled 2013-09-13 (×9): qty 0.2

## 2013-09-13 MED ORDER — MORPHINE SULFATE 2 MG/ML IJ SOLN
2.0000 mg | INTRAMUSCULAR | Status: DC | PRN
Start: 2013-09-13 — End: 2013-09-15
  Administered 2013-09-13 – 2013-09-15 (×10): 2 mg via INTRAVENOUS
  Filled 2013-09-13 (×10): qty 1

## 2013-09-13 MED ORDER — DOPAMINE-DEXTROSE 3.2-5 MG/ML-% IV SOLN
INTRAVENOUS | Status: AC
  Filled 2013-09-13: qty 250

## 2013-09-13 MED ORDER — AMIODARONE IV BOLUS ONLY 150 MG/100ML
150.0000 mg | Freq: Once | INTRAVENOUS | Status: AC
  Administered 2013-09-13: 150 mg via INTRAVENOUS
  Filled 2013-09-13: qty 100

## 2013-09-13 MED ORDER — SODIUM BICARBONATE 8.4 % IV SOLN
50.0000 meq | Freq: Once | INTRAVENOUS | Status: AC
  Administered 2013-09-13: 50 meq via INTRAVENOUS

## 2013-09-13 MED ORDER — ALBUMIN HUMAN 5 % IV SOLN
12.5000 g | INTRAVENOUS | Status: AC
  Administered 2013-09-13 (×2): 12.5 g via INTRAVENOUS
  Filled 2013-09-13: qty 500

## 2013-09-13 MED ORDER — DOPAMINE-DEXTROSE 3.2-5 MG/ML-% IV SOLN
3.0000 ug/kg/min | INTRAVENOUS | Status: DC
  Administered 2013-09-13: 3 ug/kg/min via INTRAVENOUS

## 2013-09-13 MED ORDER — CHLORHEXIDINE GLUCONATE 0.12 % MT SOLN
15.0000 mL | Freq: Two times a day (BID) | OROMUCOSAL | Status: DC
  Administered 2013-09-13 (×2): 15 mL via OROMUCOSAL
  Filled 2013-09-13: qty 15

## 2013-09-13 MED ORDER — AMIODARONE HCL IN DEXTROSE 360-4.14 MG/200ML-% IV SOLN
30.0000 mg/h | INTRAVENOUS | Status: DC
  Administered 2013-09-13 – 2013-09-14 (×2): 30 mg/h via INTRAVENOUS
  Filled 2013-09-13 (×5): qty 200

## 2013-09-13 MED FILL — Magnesium Sulfate Inj 50%: INTRAMUSCULAR | Qty: 10 | Status: AC

## 2013-09-13 MED FILL — Heparin Sodium (Porcine) Inj 1000 Unit/ML: INTRAMUSCULAR | Qty: 30 | Status: AC

## 2013-09-13 MED FILL — Potassium Chloride Inj 2 mEq/ML: INTRAVENOUS | Qty: 40 | Status: AC

## 2013-09-13 NOTE — Progress Notes (Signed)
Patient did not tolerate Dopamine. With start of Dopamine, O2 sats dropped into 80s and sustained. Patient transitioned from 4L [place], to venti mask, to NRB mask. Sats maintained at 93%. NSR on the monitor at this point. Patient having severe pain issues and not taking deep breaths. Have adminstered pain meds and attempted to pulmonary toilet. Patient quite anxious. Portable chest xray obtained. After 45 minutes on Dopamine, began having bursts of SVT with a rate in the 130s. Turned off Dopamine. Within 10 minutes of Dopamine turning off, NSR 80s, O2 sat 100% on NRB, anxiety diminished. Patient laying comfortably in bed. Dr. Cornelius Moras notified of episode. Clarified that he still wanted albumin given, Dopamine off, and order to transition to Levophed if needed for BP support. Updated of current vitals and index. Will continue to closely monitor. Thresa Ross RN

## 2013-09-13 NOTE — Progress Notes (Signed)
Spoke with Dr. Cornelius Moras regarding continued AFib and decreased UOP. Will give another  Amiodarone bolus per order. Will d/c all three chest tubes per order.

## 2013-09-13 NOTE — Progress Notes (Addendum)
[facility] DAILY ICU PROGRESS NOTE                   [address]            [place] [address]          [phone]   1 Day Post-Op Procedure(s) (LRB): CORONARY ARTERY BYPASS GRAFTING (CABG), on pump, times five, using left internal mammary artery, right greater saphenous vein hervested endoscopically. (N/A) INTRAOPERATIVE TRANSESOPHAGEAL ECHOCARDIOGRAM (N/A)  Total Length of Stay:  LOS: 2 days   Subjective: "I felt like I was checking out last night."  BPs low overnight and started on Dopamine.  Pt became anxious, tachycardic and sats dropped.  Dopamine d/c'ed and pt returned to baseline status.  This am, c/o soreness in chest, but breathing stable.   Objective: Vital signs in last 24 hours: Temp:  [97.5 F (36.4 C)-100.4 F (38 C)] 99 F (37.2 C) (09/04 0700) Pulse Rate:  [76-102] 76 (09/04 0700) Cardiac Rhythm:  [-] Normal sinus rhythm (09/04 0700) Resp:  [12-36] 20 (09/04 0700) BP: (81-120)/(53-84) 95/61 mmHg (09/04 0700) SpO2:  [89 %-100 %] 97 % (09/04 0700) Arterial Line BP: (81-245)/(40-177) 109/53 mmHg (09/04 0700) FiO2 (%):  [40 %-50 %] 40 % (09/03 2000) Weight:  [282 lb 4.8 oz (128.05 kg)] 282 lb 4.8 oz (128.05 kg) (09/04 0600)  Filed Weights   09/11/13 2356 09/12/13 0511 09/13/13 0600  Weight: 272 lb 9.6 oz (123.651 kg) 267 lb 6.7 oz (121.3 kg) 282 lb 4.8 oz (128.05 kg)  BASELINE WEIGHT: 121 kg   Weight change: 9 lb 11.2 oz (4.4 kg)   Hemodynamic parameters for last 24 hours: PAP: (25-51)/(12-29) 34/16 mmHg CO:  [3.9 L/min-7.3 L/min] 5.4 L/min CI:  [1.6 L/min/m2-2.9 L/min/m2] 2.2 L/min/m2  Intake/Output from previous day: 09/03 0701 - 09/04 0700 In: 8149 [I.V.:5009; Blood:840; IV Piggyback:2300] Out: 4650 [Urine:2350; Blood:1600; Chest Tube:700]  Intake/Output this shift: Total I/O In: 4.3 [I.V.:4.3] Out: -   CBGs 81-129-111-127-139-126-121-133-140  Current Meds: Scheduled Meds: . acetaminophen  1,000 mg Oral 4 times per day   Or  .  acetaminophen (TYLENOL) oral liquid 160 mg/5 mL  1,000 mg Per Tube 4 times per day  . antiseptic oral rinse  7 mL Mouth Rinse q12n4p  . aspirin EC  325 mg Oral Daily   Or  . aspirin  324 mg Per Tube Daily  . bisacodyl  10 mg Oral Daily   Or  . bisacodyl  10 mg Rectal Daily  . chlorhexidine  15 mL Mouth Rinse BID  . docusate sodium  200 mg Oral Daily  . DOPamine      . insulin regular  0-10 Units Intravenous TID WC  . levofloxacin (LEVAQUIN) IV  750 mg Intravenous Q24H  . metoprolol tartrate  12.5 mg Oral BID   Or  . metoprolol tartrate  12.5 mg Per Tube BID  . [START ON 09/14/2013] pantoprazole  40 mg Oral Daily  . sodium chloride  3 mL Intravenous Q12H   Continuous Infusions: . sodium chloride 20 mL/hr at 09/12/13 1500  . sodium chloride 20 mL/hr at 09/12/13 1600  . sodium chloride    . dexmedetomidine Stopped (09/12/13 1910)  . insulin (NOVOLIN-R) infusion 3.6 Units/hr (09/13/13 0751)  . lactated ringers 20 mL/hr at 09/13/13 0154  . nitroGLYCERIN Stopped (09/12/13 1600)  . phenylephrine (NEO-SYNEPHRINE) Adult infusion 60 mcg/min (09/13/13 0500)   PRN Meds:.metoprolol, midazolam, morphine injection, ondansetron (ZOFRAN) IV, oxyCODONE, sodium chloride  Physical Exam: General appearance: alert, cooperative and no distress Heart: regular rate and rhythm, +rub Lungs: Few crackles in bases, L>R Abdomen: soft, non-tender; bowel sounds normal; no masses,  no organomegaly Extremities: Mild LE edema Wound: Dressed and dry  Lab Results: CBC: Recent Labs  09/12/13 2115 09/12/13 2125 09/13/13 0250  WBC 13.6*  --  15.5*  HGB 12.8* 12.2* 12.5*  HCT 36.6* 36.0* 35.9*  PLT 123*  --  136*   BMET:  Recent Labs  09/12/13 2115 09/12/13 2125 09/13/13 0250  NA 138 138 140  K 5.4* 6.4* 4.3  CL 106 106 108  CO2 21  --  22  GLUCOSE 122* 126* 140*  BUN CREATININE 0.80 0.80 0.90  CALCIUM 7.3*  --  7.5*    PT/INR:  Recent Labs  09/12/13 1500  LABPROT 16.3*  INR  1.31   Radiology: Dg Chest Portable 1 View In Am  09/13/2013   CLINICAL DATA:  postop CABG  EXAM: PORTABLE CHEST - 1 VIEW  COMPARISON:  09/12/2013  FINDINGS: Interval extubation. Right IJ Swan-Ganz catheter tip projects over the right pulmonary artery. Mediastinal drains in place. Left-sided pleural drain in place. Small left pleural effusion and associated airspace opacity. Right lung predominantly clear. No definite pneumothorax. Cardiomediastinal contours and change. Status post median sternotomy.  IMPRESSION: Interval extubation.  Otherwise, unchanged support devices as above.  Left pleural effusion and associated consolidation, favor atelectasis.   Electronically Signed   By: Jearld Lesch M.D.   On: 09/13/2013 03:28   Dg Chest Port 1 View  09/12/2013   CLINICAL DATA:  Postop CABG  EXAM: PORTABLE CHEST - 1 VIEW  COMPARISON:  09/05/2013  FINDINGS: Endotracheal tube ends at the clavicular heads. The carina is not well discerned. Nasogastric tube enters the stomach. Right IJ Swan catheter, tip near the interlobar pulmonary artery. Thoracic drains present. No pneumothorax. New baseline size and morphology of the cardiomediastinal silhouette status post CABG. No gross enlargement concerning for hematoma. Retrocardiac opacification, postoperative atelectasis given the clinical circumstances.  IMPRESSION: 1. Tubes and lines in unremarkable position, as above. 2. Postoperative retrocardiac atelectasis. 3. No edema or pneumothorax.   Electronically Signed   By: Tiburcio Pea M.D.   On: 09/12/2013 15:37   Ct Angio Chest Aortic Dissect W &/or W/o  09/11/2013   CLINICAL DATA:  Thoracic aneurysm. Evaluate for aortic dissection. Coronary artery disease.  EXAM: CT ANGIOGRAPHY CHEST WITH CONTRAST  TECHNIQUE: Multidetector CT imaging of the chest was performed using the standard protocol during bolus administration of intravenous contrast. Multiplanar CT image reconstructions and MIPs were obtained to evaluate the  vascular anatomy.  CONTRAST:  OMNIPAQUE IOHEXOL 350 MG/ML SOLN  COMPARISON:  Chest x-ray 07/29/2013.  CT chest 06/27/2013.  FINDINGS: Thoracic aorta appears stable. Dilatation of the ascending aorta 4.6 cm again noted. No dissection. Pulmonary arteries are normal.Stable cardiomegaly. Coronary artery disease.  No significant mediastinal adenopathy noted. Thoracic esophagus is unremarkable.  Large airways are patent. Basilar atelectasis and tiny bilateral pleural effusion. Previously identified subpleural nodular densities are less well identified due to the basilar atelectasis present and tiny pleural effusions present.  Adrenals are normal. Simple cyst left kidney. Visualized upper abdominal structures otherwise unremarkable.  Degenerative changes lumbar spine. Punctate stable density lower vertebral body most likely bone island .  Review of the MIP images confirms the above findings.  IMPRESSION: 1. 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 2. No evidence of aortic dissection. 3.  Stable cardiomegaly.  Coronary artery disease. 4. Mild bibasilar atelectasis. Tiny bilateral effusions. Previously identified subpleural nodular densities are not well identified due to the basilar atelectasis and tiny pleural effusion present .   Electronically Signed   By: Maisie Fus  Register   On: 09/11/2013 20:49     Assessment/Plan: S/P Procedure(s) (LRB): CORONARY ARTERY BYPASS GRAFTING (CABG), on pump, times five, using left internal mammary artery, right greater saphenous vein hervested endoscopically. (N/A) INTRAOPERATIVE TRANSESOPHAGEAL ECHOCARDIOGRAM (N/A)  CV- SBPs low overnight requiring Neo, Albumin.  Wean gtts. as tolerated.  Maintaining SR.   Expected postop blood loss anemia- H/H generally stable.  Pulm- pulm toilet/IS.  DM- sugars stable. Wean insulin gtt today and transition to Levemir.  Resume home meds as po intake improves. A1C=6.5  L effusion/vol overload- diurese as able, leave L  pleural CT for now.  Mobilize, wean gtts, d/c lines and tubes as able, routine POD #1 progression.   COLLINS,GINA H 09/13/2013 7:55 AM  I have seen and examined the patient and agree with the assessment and plan as outlined.  Overall doing well POD1.  Mobilize.  D/C lines.  Leave chest tubes for now.  Hold diuretics until BP more stable off Neo drip.  Routine early post op.   OWEN,CLARENCE H 09/13/2013 8:35 AM

## 2013-09-13 NOTE — Progress Notes (Addendum)
Dr. Cornelius Moras notified of continued hypotension on 95mcg/min Neo drip. Patient remains in AFib, HR low 100s-110s. UOP decreased to 10-20 and hour. Will give Albumin per Dr. Orvan July order.

## 2013-09-13 NOTE — Progress Notes (Signed)
Pt's heart rate 180, with SOB, diaphoresis and lightheadedness. MD Cornelius Moras made aware.  EKG shows SVT. Amiodarone order received.  Amiodarone bolus infusing; will continue to monitor closely.

## 2013-09-13 NOTE — Progress Notes (Signed)
CT Surgery PM Rounds  Patient resting comfortably remians in controlled afib w/ stable bp

## 2013-09-13 NOTE — Progress Notes (Signed)
Spoke with Dr. Cornelius Moras regarding increasing Neo need, urine output, chest tube output, and overall status. Orders received for 2 albumin and Dopamine . Will continue to closely monitor. Thresa Ross RN

## 2013-09-14 ENCOUNTER — Inpatient Hospital Stay (HOSPITAL_COMMUNITY): Payer: BC Managed Care – PPO

## 2013-09-14 LAB — GLUCOSE, CAPILLARY
Glucose-Capillary: 202 mg/dL — ABNORMAL HIGH (ref 70–99)
Glucose-Capillary: 112 mg/dL — ABNORMAL HIGH (ref 70–99)
Glucose-Capillary: 100 mg/dL — ABNORMAL HIGH (ref 70–99)
Glucose-Capillary: 120 mg/dL — ABNORMAL HIGH (ref 70–99)
Glucose-Capillary: 118 mg/dL — ABNORMAL HIGH (ref 70–99)
Glucose-Capillary: 136 mg/dL — ABNORMAL HIGH (ref 70–99)

## 2013-09-14 LAB — BASIC METABOLIC PANEL
ANION GAP: 8 (ref 5–15)
BUN: 17 mg/dL (ref 6–23)
CALCIUM: 7.4 mg/dL — AB (ref 8.4–10.5)
CO2: 24 mEq/L (ref 19–32)
CREATININE: 1 mg/dL (ref 0.50–1.35)
Chloride: 104 mEq/L (ref 96–112)
GFR calc Af Amer: >90 mL/min (ref >90)
GFR calc non Af Amer: 79 mL/min — ABNORMAL LOW (ref >90)
Glucose, Bld: 103 mg/dL — ABNORMAL HIGH (ref 70–99)
Potassium: 4.3 mEq/L (ref 3.7–5.3)
Sodium: 136 mEq/L — ABNORMAL LOW (ref 137–147)

## 2013-09-14 LAB — CBC
HEMATOCRIT: 31.4 % — AB (ref 39.0–52.0)
Hemoglobin: 10.6 g/dL — ABNORMAL LOW (ref 13.0–17.0)
MCH: 30.5 pg (ref 26.0–34.0)
MCHC: 33.8 g/dL (ref 30.0–36.0)
MCV: 90.2 fL (ref 78.0–100.0)
Platelets: 113 10*3/uL — ABNORMAL LOW (ref 150–400)
RBC: 3.48 MIL/uL — ABNORMAL LOW (ref 4.22–5.81)
RDW: 15.5 % (ref 11.5–15.5)
WBC: 14.4 10*3/uL — ABNORMAL HIGH (ref 4.0–10.5)

## 2013-09-14 MED ORDER — INSULIN ASPART 100 UNIT/ML ~~LOC~~ SOLN
0.0000 [IU] | Freq: Three times a day (TID) | SUBCUTANEOUS | Status: DC
  Administered 2013-09-15: 3 [IU] via SUBCUTANEOUS
  Administered 2013-09-16 (×2): 2 [IU] via SUBCUTANEOUS
  Administered 2013-09-17 – 2013-09-19 (×4): 3 [IU] via SUBCUTANEOUS

## 2013-09-14 MED ORDER — FUROSEMIDE 10 MG/ML IJ SOLN
20.0000 mg | Freq: Two times a day (BID) | INTRAMUSCULAR | Status: DC
  Administered 2013-09-14 – 2013-09-16 (×5): 20 mg via INTRAVENOUS
  Filled 2013-09-14 (×7): qty 2

## 2013-09-14 MED ORDER — AMIODARONE LOAD VIA INFUSION
150.0000 mg | Freq: Once | INTRAVENOUS | Status: DC
  Administered 2013-09-14: 150 mg via INTRAVENOUS

## 2013-09-14 MED ORDER — AMIODARONE IV BOLUS ONLY 150 MG/100ML
150.0000 mg | Freq: Once | INTRAVENOUS | Status: AC
  Administered 2013-09-14: 150 mg via INTRAVENOUS

## 2013-09-14 MED ORDER — TRAMADOL HCL 50 MG PO TABS
50.0000 mg | ORAL_TABLET | Freq: Four times a day (QID) | ORAL | Status: DC | PRN
  Administered 2013-09-17 – 2013-09-19 (×4): 50 mg via ORAL
  Filled 2013-09-14 (×4): qty 1

## 2013-09-14 MED ORDER — FUROSEMIDE 10 MG/ML IJ SOLN
20.0000 mg | Freq: Four times a day (QID) | INTRAMUSCULAR | Status: DC
  Administered 2013-09-14: 20 mg via INTRAVENOUS

## 2013-09-14 MED ORDER — AMIODARONE HCL 200 MG PO TABS
400.0000 mg | ORAL_TABLET | Freq: Two times a day (BID) | ORAL | Status: DC
  Administered 2013-09-14 (×2): 400 mg via ORAL
  Filled 2013-09-14 (×4): qty 2

## 2013-09-14 MED ORDER — OXYCODONE HCL 5 MG PO TABS
5.0000 mg | ORAL_TABLET | ORAL | Status: DC | PRN
  Administered 2013-09-14 – 2013-09-15 (×2): 5 mg via ORAL
  Administered 2013-09-15: 10 mg via ORAL
  Administered 2013-09-15: 5 mg via ORAL
  Administered 2013-09-16 (×2): 10 mg via ORAL
  Filled 2013-09-14: qty 1
  Filled 2013-09-14 (×2): qty 2
  Filled 2013-09-14 (×2): qty 1
  Filled 2013-09-14: qty 2

## 2013-09-14 MED ORDER — AMIODARONE HCL IN DEXTROSE 360-4.14 MG/200ML-% IV SOLN
INTRAVENOUS | Status: AC
  Filled 2013-09-14: qty 200

## 2013-09-14 MED ORDER — AMIODARONE HCL IN DEXTROSE 360-4.14 MG/200ML-% IV SOLN
30.0000 mg/h | INTRAVENOUS | Status: AC
Start: 2013-09-14 — End: 2013-09-14

## 2013-09-14 MED ORDER — INSULIN ASPART 100 UNIT/ML ~~LOC~~ SOLN: 0.0000 [IU] | Freq: Every day | SUBCUTANEOUS | Status: DC

## 2013-09-14 NOTE — Progress Notes (Signed)
CT surgery p.m. Rounds Patient examined and record reviewed.Hemodynamics stable,labs satisfactory.Patient had stable day.and dilating it hallway. Maintaining sinus rhythm. Lasix diuresis in progress. Continue current care. VAN TRIGT III,PETER 09/14/2013

## 2013-09-14 NOTE — Progress Notes (Signed)
2 Days Post-Op Procedure(s) (LRB): CORONARY ARTERY BYPASS GRAFTING (CABG), on pump, times five, using left internal mammary artery, right greater saphenous vein hervested endoscopically. (N/A) INTRAOPERATIVE TRANSESOPHAGEAL ECHOCARDIOGRAM (N/A) Subjective: Second postop day after multivessel CABG for unstable angina Patient developed rapid atrial fibrillation yesterday, converted to sinus rhythm approximately midnight Currently atrially paced at 90 per minute with stable vital signs We'll transition from IV to oral amiodarone Lasix to begin for fluid overload Surgical pain well-controlled-patient appears comfortable and pleasant  Objective: Vital signs in last 24 hours: Temp:  [97.3 F (36.3 C)-98.4 F (36.9 C)] 97.7 F (36.5 C) (09/05 0740) Pulse Rate:  [26-135] 80 (09/05 1100) Cardiac Rhythm:  [-] Atrial paced (09/05 0800) Resp:  [10-43] 15 (09/05 1100) BP: (72-117)/(48-75) 98/62 mmHg (09/05 1100) SpO2:  [92 %-100 %] 96 % (09/05 1100) Arterial Line BP: (74-123)/(47-73) 103/58 mmHg (09/05 0900) FiO2 (%):  [55 %] 55 % (09/04 1800) Weight:  [288 lb 3.2 oz (130.727 kg)] 288 lb 3.2 oz (130.727 kg) (09/05 0615)  Hemodynamic parameters for last 24 hours:   stable  Intake/Output from previous day: 09/04 0701 - 09/05 0700 In: 2304.6 [P.O.:240; I.V.:1664.6; IV Piggyback:400] Out: 1010 [Urine:760; Chest Tube:250] Intake/Output this shift: Total I/O In: 198.1 [P.O.:120; I.V.:78.1] Out: 740 [Urine:740]  Alert and comfortable sitting in chair Lungs are clear Mild ankle edema Heart rate regular, no murmur or rub Abdomen soft  Lab Results:  Recent Labs  09/13/13 1645 09/13/13 1653 09/14/13 0443  WBC 16.3*  --  14.4*  HGB 11.5* 11.9* 10.6*  HCT 33.6* 35.0* 31.4*  PLT 138*  --  113*   BMET:  Recent Labs  09/13/13 0250  09/13/13 1653 09/14/13 0443  NA 140  < > 137 136*  K 4.3  < > 4.3 4.3  CL 108  --  106 104  CO2 22  --   --  24  GLUCOSE 140*  < > 183* 103*  BUN 12   --  15 17  CREATININE 0.90  < > 1.10 1.00  CALCIUM 7.5*  --   --  7.4*  < > = values in this interval not displayed.  PT/INR:  Recent Labs  09/12/13 1500  LABPROT 16.3*  INR 1.31   ABG    Component Value Date/Time   PHART 7.328* 09/13/2013 1151   HCO3 21.2 09/13/2013 1151   TCO2 20 09/13/2013 1653   ACIDBASEDEF 5.0* 09/13/2013 1151   O2SAT 91.0 09/13/2013 1151   CBG (last 3)   Recent Labs  09/13/13 2332 09/14/13 0401 09/14/13 0737  GLUCAP 122* 112* 100*    Assessment/Plan: S/P  Continue amiodarone, no indication for starting Coumadin at this time Wean off neo-and start Lasix diuresis Ambulation hallway at least twice a day Continue low dose beta blocker and Lovenox   LOS: 3 days    VAN TRIGT III,PETER 09/14/2013

## 2013-09-15 ENCOUNTER — Inpatient Hospital Stay (HOSPITAL_COMMUNITY): Payer: BC Managed Care – PPO

## 2013-09-15 ENCOUNTER — Encounter (HOSPITAL_COMMUNITY): Payer: Self-pay | Admitting: Thoracic Surgery (Cardiothoracic Vascular Surgery)

## 2013-09-15 LAB — BASIC METABOLIC PANEL
Anion gap: 8 (ref 5–15)
BUN: 19 mg/dL (ref 6–23)
CO2: 26 mEq/L (ref 19–32)
Calcium: 7.7 mg/dL — ABNORMAL LOW (ref 8.4–10.5)
Chloride: 101 mEq/L (ref 96–112)
Creatinine, Ser: 0.93 mg/dL (ref 0.50–1.35)
GFR calc Af Amer: >90 mL/min (ref >90)
GFR calc non Af Amer: 88 mL/min — ABNORMAL LOW (ref >90)
Glucose, Bld: 100 mg/dL — ABNORMAL HIGH (ref 70–99)
Potassium: 3.9 mEq/L (ref 3.7–5.3)
Sodium: 135 mEq/L — ABNORMAL LOW (ref 137–147)

## 2013-09-15 LAB — CBC
HCT: 30.2 % — ABNORMAL LOW (ref 39.0–52.0)
Hemoglobin: 10.4 g/dL — ABNORMAL LOW (ref 13.0–17.0)
MCH: 30.9 pg (ref 26.0–34.0)
MCHC: 34.4 g/dL (ref 30.0–36.0)
MCV: 89.6 fL (ref 78.0–100.0)
Platelets: 102 10*3/uL — ABNORMAL LOW (ref 150–400)
RBC: 3.37 MIL/uL — ABNORMAL LOW (ref 4.22–5.81)
RDW: 15.2 % (ref 11.5–15.5)
WBC: 10.6 10*3/uL — ABNORMAL HIGH (ref 4.0–10.5)

## 2013-09-15 LAB — GLUCOSE, CAPILLARY
Glucose-Capillary: 101 mg/dL — ABNORMAL HIGH (ref 70–99)
Glucose-Capillary: 120 mg/dL — ABNORMAL HIGH (ref 70–99)
Glucose-Capillary: 100 mg/dL — ABNORMAL HIGH (ref 70–99)

## 2013-09-15 MED ORDER — AMIODARONE IV BOLUS ONLY 150 MG/100ML
150.0000 mg | Freq: Once | INTRAVENOUS | Status: AC
  Administered 2013-09-15: 150 mg via INTRAVENOUS
  Filled 2013-09-15: qty 100

## 2013-09-15 MED ORDER — DILTIAZEM HCL 100 MG IV SOLR
5.0000 mg/h | INTRAVENOUS | Status: DC
  Administered 2013-09-15: 5 mg/h via INTRAVENOUS
  Filled 2013-09-15: qty 100

## 2013-09-15 MED ORDER — WARFARIN - PHYSICIAN DOSING INPATIENT
Freq: Every day | Status: DC
  Administered 2013-09-17: 18:00:00

## 2013-09-15 MED ORDER — AMIODARONE HCL 200 MG PO TABS
400.0000 mg | ORAL_TABLET | Freq: Three times a day (TID) | ORAL | Status: DC
  Administered 2013-09-15 – 2013-09-17 (×9): 400 mg via ORAL
  Filled 2013-09-15 (×12): qty 2

## 2013-09-15 MED ORDER — POTASSIUM CHLORIDE 10 MEQ/50ML IV SOLN
10.0000 meq | INTRAVENOUS | Status: AC
  Administered 2013-09-15 (×2): 10 meq via INTRAVENOUS
  Filled 2013-09-15: qty 50

## 2013-09-15 MED ORDER — DILTIAZEM HCL 100 MG IV SOLR
5.0000 mg/h | INTRAVENOUS | Status: AC
  Administered 2013-09-16: 5 mg/h via INTRAVENOUS

## 2013-09-15 MED ORDER — WARFARIN SODIUM 5 MG PO TABS
5.0000 mg | ORAL_TABLET | Freq: Every day | ORAL | Status: DC
  Administered 2013-09-15 – 2013-09-18 (×4): 5 mg via ORAL
  Filled 2013-09-15 (×6): qty 1

## 2013-09-15 MED ORDER — POTASSIUM CHLORIDE CRYS ER 20 MEQ PO TBCR
20.0000 meq | EXTENDED_RELEASE_TABLET | Freq: Two times a day (BID) | ORAL | Status: DC
  Administered 2013-09-15 – 2013-09-19 (×10): 20 meq via ORAL
  Filled 2013-09-15 (×12): qty 1

## 2013-09-15 MED ORDER — ENOXAPARIN SODIUM 40 MG/0.4ML ~~LOC~~ SOLN
40.0000 mg | SUBCUTANEOUS | Status: DC
  Administered 2013-09-15 – 2013-09-17 (×3): 40 mg via SUBCUTANEOUS
  Filled 2013-09-15 (×5): qty 0.4

## 2013-09-15 NOTE — Progress Notes (Signed)
CT surgery p.m. Rounds  Patient examined and record reviewed.Hemodynamics stable,labs satisfactory.Patient had stable day.Continue current care. Maintaining sinus rhythm Continue IV Cardizem drip 5 mg per hour until a.m. if heart rate drops less than 60 will discontinue Cardizem  drip VAN TRIGT III,Ladelle Teodoro 09/15/2013

## 2013-09-15 NOTE — Progress Notes (Signed)
3 Days Post-Op Procedure(s) (LRB): CORONARY ARTERY BYPASS GRAFTING (CABG), on pump, times five, using left internal mammary artery, right greater saphenous vein hervested endoscopically. (N/A) INTRAOPERATIVE TRANSESOPHAGEAL ECHOCARDIOGRAM (N/A) Subjective: recurrent afib overnite now back in NSR Will start coumadin for PAF  Objective: Vital signs in last 24 hours: Temp:  [97.8 F (36.6 C)-98.6 F (37 C)] 98.2 F (36.8 C) (09/06 0400) Pulse Rate:  [71-139] 74 (09/06 0800) Cardiac Rhythm:  [-] Normal sinus rhythm (09/06 0800) Resp:  [15-35] 24 (09/06 0800) BP: (91-146)/(46-95) 103/68 mmHg (09/06 0800) SpO2:  [92 %-100 %] 96 % (09/06 0800) Weight:  [279 lb 15.8 oz (127 kg)] 279 lb 15.8 oz (127 kg) (09/05 2000)  Hemodynamic parameters for last 24 hours:  stable  Intake/Output from previous day: 09/05 0701 - 09/06 0700 In: 435.8 [P.O.:220; I.V.:215.8] Out: 2465 [Urine:2465] Intake/Output this shift: Total I/O In: 50 [IV Piggyback:50] Out: -   OOB to chair CXR with atelectasis L base abd nontender Lab Results:  Recent Labs  09/14/13 0443 09/15/13 0441  WBC 14.4* 10.6*  HGB 10.6* 10.4*  HCT 31.4* 30.2*  PLT 113* 102*   BMET:  Recent Labs  09/14/13 0443 09/15/13 0441  NA 136* 135*  K 4.3 3.9  CL 104 101  CO2 24 26  GLUCOSE 103* 100*  BUN 17 19  CREATININE 1.00 0.93  CALCIUM 7.4* 7.7*    PT/INR:  Recent Labs  09/12/13 1500  LABPROT 16.3*  INR 1.31   ABG    Component Value Date/Time   PHART 7.328* 09/13/2013 1151   HCO3 21.2 09/13/2013 1151   TCO2 20 09/13/2013 1653   ACIDBASEDEF 5.0* 09/13/2013 1151   O2SAT 91.0 09/13/2013 1151   CBG (last 3)   Recent Labs  09/14/13 1653 09/14/13 2156 09/15/13 0435  GLUCAP 118* 136* 101*    Assessment/Plan: S/P Procedure(s) (LRB): CORONARY ARTERY BYPASS GRAFTING (CABG), on pump, times five, using left internal mammary artery, right greater saphenous vein hervested endoscopically. (N/A) INTRAOPERATIVE  TRANSESOPHAGEAL ECHOCARDIOGRAM (N/A) Mobilize Diuresis Diabetes control d/c tubes/lines - central line and Foley  keep in ICU for rapid Afib with low BP  LOS: 4 days    VAN TRIGT III,PETER 09/15/2013

## 2013-09-16 ENCOUNTER — Encounter (HOSPITAL_COMMUNITY): Payer: Self-pay | Admitting: Thoracic Surgery (Cardiothoracic Vascular Surgery)

## 2013-09-16 ENCOUNTER — Inpatient Hospital Stay (HOSPITAL_COMMUNITY): Payer: BC Managed Care – PPO

## 2013-09-16 DIAGNOSIS — I4891 Unspecified atrial fibrillation: Secondary | ICD-10-CM

## 2013-09-16 DIAGNOSIS — J90 Pleural effusion, not elsewhere classified: Secondary | ICD-10-CM

## 2013-09-16 DIAGNOSIS — E8779 Other fluid overload: Secondary | ICD-10-CM

## 2013-09-16 DIAGNOSIS — E669 Obesity, unspecified: Secondary | ICD-10-CM

## 2013-09-16 LAB — GLUCOSE, CAPILLARY
GLUCOSE-CAPILLARY: 136 mg/dL — AB (ref 70–99)
Glucose-Capillary: 128 mg/dL — ABNORMAL HIGH (ref 70–99)
Glucose-Capillary: 102 mg/dL — ABNORMAL HIGH (ref 70–99)
Glucose-Capillary: 115 mg/dL — ABNORMAL HIGH (ref 70–99)
Glucose-Capillary: 128 mg/dL — ABNORMAL HIGH (ref 70–99)
Glucose-Capillary: 145 mg/dL — ABNORMAL HIGH (ref 70–99)

## 2013-09-16 LAB — CBC
HCT: 30.6 % — ABNORMAL LOW (ref 39.0–52.0)
Hemoglobin: 10.5 g/dL — ABNORMAL LOW (ref 13.0–17.0)
MCH: 30.8 pg (ref 26.0–34.0)
MCHC: 34.3 g/dL (ref 30.0–36.0)
MCV: 89.7 fL (ref 78.0–100.0)
Platelets: 143 10*3/uL — ABNORMAL LOW (ref 150–400)
RBC: 3.41 MIL/uL — ABNORMAL LOW (ref 4.22–5.81)
RDW: 15.1 % (ref 11.5–15.5)
WBC: 10.3 10*3/uL (ref 4.0–10.5)

## 2013-09-16 LAB — BASIC METABOLIC PANEL
Anion gap: 11 (ref 5–15)
BUN: 22 mg/dL (ref 6–23)
CO2: 28 mEq/L (ref 19–32)
Calcium: 8.1 mg/dL — ABNORMAL LOW (ref 8.4–10.5)
Chloride: 102 mEq/L (ref 96–112)
Creatinine, Ser: 1.04 mg/dL (ref 0.50–1.35)
GFR calc Af Amer: 87 mL/min — ABNORMAL LOW (ref >90)
GFR calc non Af Amer: 75 mL/min — ABNORMAL LOW (ref >90)
Glucose, Bld: 109 mg/dL — ABNORMAL HIGH (ref 70–99)
Potassium: 4.2 mEq/L (ref 3.7–5.3)
Sodium: 141 mEq/L (ref 137–147)

## 2013-09-16 LAB — PROTIME-INR
INR: 1.21 (ref 0.00–1.49)
Prothrombin Time: 15.3 s — ABNORMAL HIGH (ref 11.6–15.2)

## 2013-09-16 MED ORDER — DIGOXIN 125 MCG PO TABS
0.1250 mg | ORAL_TABLET | Freq: Every day | ORAL | Status: DC
  Administered 2013-09-17: 0.125 mg via ORAL
  Filled 2013-09-16 (×2): qty 1

## 2013-09-16 MED ORDER — DIGOXIN 0.25 MG/ML IJ SOLN
0.2500 mg | Freq: Every day | INTRAMUSCULAR | Status: AC
  Administered 2013-09-16: 0.25 mg via INTRAVENOUS
  Filled 2013-09-16 (×2): qty 1

## 2013-09-16 MED ORDER — DILTIAZEM HCL 100 MG IV SOLR
10.0000 mg/h | INTRAVENOUS | Status: DC
  Administered 2013-09-16 (×2): 10 mg/h via INTRAVENOUS
  Filled 2013-09-16: qty 100

## 2013-09-16 NOTE — Progress Notes (Signed)
CT surgery p.m. Rounds  Maintaining sinus rhythm today despite 3 walks in the hallway Cardizem drip at 10 mg per hour We'll slowly wean off overnight And continue amiodarone and metoprolol

## 2013-09-16 NOTE — Progress Notes (Signed)
Subjective:  DAy 4 s/p CABG x5  Objective:   Vital Signs in the last 24 hours: Temp:  [97.6 F (36.4 C)-98.1 F (36.7 C)] 98.1 F (36.7 C) (09/07 0400) Pulse Rate:  [68-117] 77 (09/07 0500) Resp:  [14-29] 14 (09/07 0500) BP: (100-132)/(61-83) 100/70 mmHg (09/07 0500) SpO2:  [92 %-97 %] 97 % (09/07 0500) Weight:  [284 lb 2.8 oz (128.9 kg)] 284 lb 2.8 oz (128.9 kg) (09/07 0600)  Intake/Output from previous day: -1601 09/06 0701 - 09/07 0700 In: 98.6 [I.V.:48.6; IV Piggyback:50] Out: 1700 [Urine:1700]  I/O since admission: +1164 Weight 269 on admission; today 284  (15 lb increase)  Medications: . acetaminophen  1,000 mg Oral 4 times per day  . amiodarone  400 mg Oral TID  . antiseptic oral rinse  7 mL Mouth Rinse q12n4p  . aspirin EC  325 mg Oral Daily  . bisacodyl  10 mg Oral Daily   Or  . bisacodyl  10 mg Rectal Daily  . docusate sodium  200 mg Oral Daily  . enoxaparin (LOVENOX) injection  40 mg Subcutaneous Q24H  . furosemide  20 mg Intravenous BID  . insulin aspart  0-20 Units Subcutaneous TID WC  . insulin aspart  0-5 Units Subcutaneous QHS  . insulin detemir  20 Units Subcutaneous BID  . metoprolol tartrate  12.5 mg Oral BID  . pantoprazole  40 mg Oral Daily  . potassium chloride  20 mEq Oral BID  . sodium chloride  3 mL Intravenous Q12H  . warfarin  5 mg Oral q1800  . Warfarin - Physician Dosing Inpatient   Does not apply q1800         Physical Exam:   General appearance: alert, cooperative and no distress Neck: no adenopathy, no JVD, supple, symmetrical, trachea midline and thyroid not enlarged, symmetric, no tenderness/mass/nodules Lungs: decreased BS bases; no wheezing Heart: irregularly irregular rhythm; no rub Abdomen: soft, non-tender; bowel sounds normal; no masses,  no organomegaly Extremities: mild LE edema bilaterally Neurologic: Grossly normal   Rate: 95-105  Rhythm: atrial fibrillation  Lab Results:   Recent Labs  09/15/13 0441  09/16/13 0235  NA 135* 141  K 3.9 4.2  CL 101 102  CO2 26 28  GLUCOSE 100* 109*  BUN 19 22  CREATININE 0.93 1.04    No results found for this basename: TROPONINI, CK, MB,  in the last 72 hours  Hepatic Function Panel No results found for this basename: PROT, ALBUMIN, AST, ALT, ALKPHOS, BILITOT, BILIDIR, IBILI,  in the last 72 hours  Recent Labs  09/16/13 0235  INR 1.21   BNP (last 3 results)  Recent Labs  09/11/13 1927  PROBNP 65.0    Lipid Panel     Component Value Date/Time   CHOL 108 09/12/2013 0401   TRIG 181* 09/12/2013 0401   TRIG 183* 08/20/2013 0842   HDL 30* 09/12/2013 0401   CHOLHDL 3.6 09/12/2013 0401   VLDL 36 09/12/2013 0401   LDLCALC 42 09/12/2013 0401   LDLCALC 45 08/20/2013 0842      Imaging:  Dg Chest Port 1 View  09/15/2013   CLINICAL DATA:  Prior CABG.  EXAM: PORTABLE CHEST - 1 VIEW  COMPARISON:  One day prior  FINDINGS: Right IJ Cordis sheath unchanged. Cardiomegaly accentuated by AP portable technique. A small layering left pleural effusion is not significantly changed. No pneumothorax. Low lung volumes with resultant pulmonary interstitial prominence. Clear right lung. Improved left lower lobe predominant atelectasis.  IMPRESSION: Minimal improvement in left-sided aeration with persistent layering left pleural effusion and adjacent atelectasis.   Electronically Signed   By: Jeronimo Greaves M.D.   On: 09/15/2013 07:53      Assessment/Plan:   Principal Problem:   S/P CABG x 5 Active Problems:   CAD (coronary artery disease)   Dyslipidemia   HTN (hypertension)   Obesity (BMI 30.0-34.9)   DM2 (diabetes mellitus, type 2)   Unstable angina   Ascending aorta dilatation   Atrial fibrillation, post-operative   Day 4 s/p CABG x5 for multivesseal CAD  AFib on cardizem 5 mg/hr, being loaded with amiodarone 400 mg tid and on metoprolol 12.5 mg bid.  Volume overload with mild edema, pleural effusion and net weight gain of 15 lbs since admission. Getting  lasix 20 mg iv bid. BP is low at 95-100 limiting more aggressive Rx presently.  Pleural effusion/atelectasis DM2 Obesity    Lennette Bihari, MD, [facility] 09/16/2013, 7:53 AM

## 2013-09-17 DIAGNOSIS — Z951 Presence of aortocoronary bypass graft: Secondary | ICD-10-CM

## 2013-09-17 DIAGNOSIS — I25810 Atherosclerosis of coronary artery bypass graft(s) without angina pectoris: Secondary | ICD-10-CM

## 2013-09-17 DIAGNOSIS — I10 Essential (primary) hypertension: Secondary | ICD-10-CM

## 2013-09-17 DIAGNOSIS — I200 Unstable angina: Secondary | ICD-10-CM

## 2013-09-17 LAB — GLUCOSE, CAPILLARY
GLUCOSE-CAPILLARY: 88 mg/dL (ref 70–99)
Glucose-Capillary: 131 mg/dL — ABNORMAL HIGH (ref 70–99)
Glucose-Capillary: 85 mg/dL (ref 70–99)
Glucose-Capillary: 135 mg/dL — ABNORMAL HIGH (ref 70–99)

## 2013-09-17 LAB — BASIC METABOLIC PANEL
Anion gap: 11 (ref 5–15)
BUN: 25 mg/dL — ABNORMAL HIGH (ref 6–23)
CO2: 27 mEq/L (ref 19–32)
Calcium: 8.5 mg/dL (ref 8.4–10.5)
Chloride: 101 mEq/L (ref 96–112)
Creatinine, Ser: 0.94 mg/dL (ref 0.50–1.35)
GFR calc Af Amer: >90 mL/min (ref >90)
GFR calc non Af Amer: 88 mL/min — ABNORMAL LOW (ref >90)
Glucose, Bld: 87 mg/dL (ref 70–99)
Potassium: 4 mEq/L (ref 3.7–5.3)
Sodium: 139 mEq/L (ref 137–147)

## 2013-09-17 LAB — PROTIME-INR
INR: 1.32 (ref 0.00–1.49)
Prothrombin Time: 16.4 seconds — ABNORMAL HIGH (ref 11.6–15.2)

## 2013-09-17 LAB — CBC
HCT: 31.6 % — ABNORMAL LOW (ref 39.0–52.0)
Hemoglobin: 10.8 g/dL — ABNORMAL LOW (ref 13.0–17.0)
MCH: 30.3 pg (ref 26.0–34.0)
MCHC: 34.2 g/dL (ref 30.0–36.0)
MCV: 88.8 fL (ref 78.0–100.0)
Platelets: 218 10*3/uL (ref 150–400)
RBC: 3.56 MIL/uL — ABNORMAL LOW (ref 4.22–5.81)
RDW: 15 % (ref 11.5–15.5)
WBC: 9.5 10*3/uL (ref 4.0–10.5)

## 2013-09-17 MED ORDER — VITAMIN B-6 100 MG PO TABS
200.0000 mg | ORAL_TABLET | Freq: Every day | ORAL | Status: DC
  Administered 2013-09-17 – 2013-09-20 (×4): 200 mg via ORAL
  Filled 2013-09-17 (×4): qty 2

## 2013-09-17 MED ORDER — SODIUM CHLORIDE 0.9 % IJ SOLN: 3.0000 mL | INTRAMUSCULAR | Status: DC | PRN

## 2013-09-17 MED ORDER — METFORMIN HCL 500 MG PO TABS
1000.0000 mg | ORAL_TABLET | Freq: Two times a day (BID) | ORAL | Status: DC
  Administered 2013-09-17 – 2013-09-20 (×7): 1000 mg via ORAL
  Filled 2013-09-17 (×9): qty 2

## 2013-09-17 MED ORDER — FUROSEMIDE 40 MG PO TABS
40.0000 mg | ORAL_TABLET | Freq: Two times a day (BID) | ORAL | Status: DC
  Administered 2013-09-17 – 2013-09-19 (×5): 40 mg via ORAL
  Filled 2013-09-17 (×8): qty 1

## 2013-09-17 MED ORDER — METOPROLOL TARTRATE 25 MG PO TABS
25.0000 mg | ORAL_TABLET | Freq: Two times a day (BID) | ORAL | Status: DC
Start: 2013-09-17 — End: 2013-09-17
  Filled 2013-09-17 (×2): qty 1

## 2013-09-17 MED ORDER — ASPIRIN EC 81 MG PO TBEC
81.0000 mg | DELAYED_RELEASE_TABLET | Freq: Every day | ORAL | Status: DC
  Filled 2013-09-17: qty 1

## 2013-09-17 MED ORDER — MOVING RIGHT ALONG BOOK
Freq: Once | Status: AC
  Administered 2013-09-17: 10:00:00
  Filled 2013-09-17: qty 1

## 2013-09-17 MED ORDER — CARVEDILOL 6.25 MG PO TABS
6.2500 mg | ORAL_TABLET | Freq: Two times a day (BID) | ORAL | Status: DC
  Administered 2013-09-17 – 2013-09-18 (×3): 6.25 mg via ORAL
  Filled 2013-09-17 (×5): qty 1

## 2013-09-17 MED ORDER — ASPIRIN EC 81 MG PO TBEC
81.0000 mg | DELAYED_RELEASE_TABLET | Freq: Every day | ORAL | Status: DC
  Administered 2013-09-17 – 2013-09-20 (×4): 81 mg via ORAL
  Filled 2013-09-17 (×4): qty 1

## 2013-09-17 MED ORDER — CANAGLIFLOZIN-METFORMIN HCL 50-1000 MG PO TABS: 1.0000 | ORAL_TABLET | Freq: Two times a day (BID) | ORAL | Status: DC

## 2013-09-17 MED ORDER — SODIUM CHLORIDE 0.9 % IV SOLN: 250.0000 mL | INTRAVENOUS | Status: DC | PRN

## 2013-09-17 MED ORDER — CANAGLIFLOZIN 100 MG PO TABS
50.0000 mg | ORAL_TABLET | Freq: Two times a day (BID) | ORAL | Status: DC
  Administered 2013-09-17 – 2013-09-20 (×7): 50 mg via ORAL
  Filled 2013-09-17 (×9): qty 0.5

## 2013-09-17 MED ORDER — SODIUM CHLORIDE 0.9 % IJ SOLN
3.0000 mL | Freq: Two times a day (BID) | INTRAMUSCULAR | Status: DC
  Administered 2013-09-17 – 2013-09-19 (×2): 3 mL via INTRAVENOUS

## 2013-09-17 MED ORDER — ALLOPURINOL 300 MG PO TABS
300.0000 mg | ORAL_TABLET | Freq: Every day | ORAL | Status: DC
  Administered 2013-09-17 – 2013-09-20 (×4): 300 mg via ORAL
  Filled 2013-09-17 (×4): qty 1

## 2013-09-17 MED ORDER — ATORVASTATIN CALCIUM 40 MG PO TABS
40.0000 mg | ORAL_TABLET | Freq: Every day | ORAL | Status: DC
  Administered 2013-09-17 – 2013-09-19 (×3): 40 mg via ORAL
  Filled 2013-09-17 (×4): qty 1

## 2013-09-17 NOTE — Progress Notes (Signed)
Pt ambulated 200 feet on room air using rolling walker, pt tolerated well, pt assisted back to chair with call bell in reach, family at bedside Archie Balboa, RN

## 2013-09-17 NOTE — Progress Notes (Signed)
      [address]       [place] [address]             [phone]     CARDIOTHORACIC SURGERY PROGRESS NOTE  5 Days Post-Op  S/P Procedure(s) (LRB): CORONARY ARTERY BYPASS GRAFTING (CABG), on pump, times five, using left internal mammary artery, right greater saphenous vein hervested endoscopically. (N/A) INTRAOPERATIVE TRANSESOPHAGEAL ECHOCARDIOGRAM (N/A)  Subjective: Feels well.  Mild soreness in chest.  Appetite improving.  No SOB  Objective: Vital signs in last 24 hours: Temp:  [97.3 F (36.3 C)-98.2 F (36.8 C)] 97.8 F (36.6 C) (09/08 0400) Pulse Rate:  [65-113] 71 (09/08 0600) Cardiac Rhythm:  [-] Normal sinus rhythm (09/08 0600) Resp:  [12-27] 20 (09/08 0600) BP: (89-146)/(58-82) 125/73 mmHg (09/08 0600) SpO2:  [92 %-99 %] 97 % (09/08 0600) Weight:  [127 kg (279 lb 15.8 oz)] 127 kg (279 lb 15.8 oz) (09/08 0500)  Physical Exam:  Rhythm:   Sinus overnight  Breath sounds: clear  Heart sounds:  RRR  Incisions:  Clean and dry  Abdomen:  Soft, non-distended, non-tender  Extremities:  Warm, well-perfused    Intake/Output from previous day: 09/07 0701 - 09/08 0700 In: 914 [P.O.:720; I.V.:194] Out: 2450 [Urine:2450] Intake/Output this shift:    Lab Results:  Recent Labs  09/16/13 0235 09/17/13 0326  WBC 10.3 9.5  HGB 10.5* 10.8*  HCT 30.6* 31.6*  PLT 143* 218   BMET:  Recent Labs  09/16/13 0235 09/17/13 0326  NA 141 139  K 4.2 4.0  CL 102 101  CO2 28 27  GLUCOSE 109* 87  BUN 22 25*  CREATININE 1.04 0.94  CALCIUM 8.1* 8.5    CBG (last 3)   Recent Labs  09/16/13 1110 09/16/13 1602 09/16/13 2029  GLUCAP 136* 128* 145*   PT/INR:   Recent Labs  09/17/13 0326  LABPROT 16.4*  INR 1.32    CXR:  N/A  Assessment/Plan: S/P Procedure(s) (LRB): CORONARY ARTERY BYPASS GRAFTING (CABG), on pump, times five, using left internal mammary artery, right greater saphenous vein hervested endoscopically. (N/A) INTRAOPERATIVE  TRANSESOPHAGEAL ECHOCARDIOGRAM (N/A)  Doing well POD5 Post-op Afib, recurrent, paroxysmal - currently maintaining NSR off Cardizem drip Expected post op acute blood loss anemia, mild, stable Expected post op volume excess, mild, diuresing Expected post op atelectasis, mild Obstructive sleep apnea, clinically likely - not diagnosed PTA Type II diabetes mellitus, excellent glycemic control   Will restart Coreg and stop Metoprolol since patient was taking it at home  Continue amiodarone load  Coumadin  Low dose lovenox until INR increases  Continue digoxin for now  Stop levemir and restart home oral agent for DMII  Mobilize  Diuresis  Transfer step down    Ralene Gasparyan H 09/17/2013 7:38 AM

## 2013-09-17 NOTE — Progress Notes (Signed)
Pt ambulated 300 feet on room air, pt tolerated well Archie Balboa, RN

## 2013-09-17 NOTE — Progress Notes (Signed)
Pt received into room 2w25, family at bedside, tele placed on pt, vitals taken, pt sitting in chair, pt states of soreness in chest, will continue to monitor closely Archie Balboa, RN

## 2013-09-17 NOTE — Progress Notes (Signed)
Transferred to 2W25 via WC and monitor. SCD's with pt. Patient placed in chair and tele box applied. RN to receive in room.

## 2013-09-17 NOTE — Progress Notes (Signed)
[phone] Came to walk with pt. Has walked twice already and would like to wait a lilttle while. Will walk with staff later. Talked with pt about CRP 2 and importance for risk factor improvement. Pt is not sure he wants to attend but is sure he will walk as we instruct. Will ask pt again closer to discharge about referral. Encouraged IS and flutter valve. Discussed with pt some healthy diet tips and will go more in detail prior to discharge.  Will follow up tomorrow. Luetta Nutting RN BSN 09/17/2013 2:54 PM

## 2013-09-17 NOTE — Care Management Note (Unsigned)
    Page 1 of 1   09/17/2013     2:52:34 PM CARE MANAGEMENT NOTE 09/17/2013  Patient:  Frank Moreno,Frank Moreno   Account Number:  [id number]  Date Initiated:  09/17/2013  Documentation initiated by:  Bradd Merlos  Subjective/Objective Assessment:   Pt adm s/p CABG x 5 on 09/12/13.  PTA, pt independent, lives with spouse.     Action/Plan:   Wife to provide care at dc.  Will follow for dc needs as pt progresses.   Anticipated DC Date:  09/20/2013   Anticipated DC Plan:  HOME W HOME HEALTH SERVICES      DC Planning Services  CM consult      Choice offered to / List presented to:             Status of service:  In process, will continue to follow Medicare Important Message given?   (If response is "NO", the following Medicare IM given date fields will be blank) Date Medicare IM given:   Medicare IM given by:   Date Additional Medicare IM given:   Additional Medicare IM given by:    Discharge Disposition:    Per UR Regulation:  Reviewed for med. necessity/level of care/duration of stay  If discussed at Long Length of Stay Meetings, dates discussed:    Comments:

## 2013-09-17 NOTE — Progress Notes (Signed)
Report called to 2W RN 

## 2013-09-17 NOTE — Progress Notes (Signed)
Subjective:  Day 5 s/p CABG x5  Objective:   Vital Signs in the last 24 hours: Temp:  [97.3 F (36.3 C)-98.2 F (36.8 C)] 97.8 F (36.6 C) (09/08 0400) Pulse Rate:  [65-113] 77 (09/08 0800) Resp:  [12-27] 17 (09/08 0800) BP: (89-146)/(59-82) 103/70 mmHg (09/08 0800) SpO2:  [92 %-99 %] 94 % (09/08 0800) Weight:  [279 lb 15.8 oz (127 kg)] 279 lb 15.8 oz (127 kg) (09/08 0500)  Intake/Output from previous day: -1601 09/07 0701 - 09/08 0700 In: 914 [P.O.:720; I.V.:194] Out: 2450 [Urine:2450]  I/O since admission: +1164 Weight 269 on admission; today 284  (15 lb increase)  Medications: . acetaminophen  1,000 mg Oral 4 times per day  . allopurinol  300 mg Oral Daily  . amiodarone  400 mg Oral TID  . antiseptic oral rinse  7 mL Mouth Rinse q12n4p  . aspirin EC  81 mg Oral Daily  . atorvastatin  40 mg Oral q1800  . bisacodyl  10 mg Oral Daily   Or  . bisacodyl  10 mg Rectal Daily  . Canagliflozin  50 mg Oral BID WC   And  . metFORMIN  1,000 mg Oral BID WC  . carvedilol  6.25 mg Oral BID WC  . digoxin  0.125 mg Oral Daily  . docusate sodium  200 mg Oral Daily  . enoxaparin (LOVENOX) injection  40 mg Subcutaneous Q24H  . furosemide  40 mg Oral BID  . insulin aspart  0-20 Units Subcutaneous TID WC  . insulin aspart  0-5 Units Subcutaneous QHS  . moving right along book   Does not apply Once  . pantoprazole  40 mg Oral Daily  . potassium chloride  20 mEq Oral BID  . pyridoxine  200 mg Oral Daily  . sodium chloride  3 mL Intravenous Q12H  . sodium chloride  3 mL Intravenous Q12H  . warfarin  5 mg Oral q1800  . Warfarin - Physician Dosing Inpatient   Does not apply q1800    Physical Exam:   General appearance: alert, cooperative and no distress Neck: no adenopathy, no JVD, supple, symmetrical, trachea midline and thyroid not enlarged, symmetric, no tenderness/mass/nodules Lungs: decreased BS bases; crackles B/L, no wheezing Heart: irregularly irregular rhythm; no  rub Abdomen: soft, non-tender; bowel sounds normal; no masses,  no organomegaly Extremities: mild LE edema bilaterally Neurologic: Grossly normal  Telemetry: NSR 65 BPM  Lab Results:   Recent Labs  09/16/13 0235 09/17/13 0326  NA 141 139  K 4.2 4.0  CL 102 101  CO2 28 27  GLUCOSE 109* 87  BUN 22 25*  CREATININE 1.04 0.94    Recent Labs  09/17/13 0326  INR 1.32   BNP (last 3 results)  Recent Labs  09/11/13 1927  PROBNP 65.0    Lipid Panel     Component Value Date/Time   CHOL 108 09/12/2013 0401   TRIG 181* 09/12/2013 0401   TRIG 183* 08/20/2013 0842   HDL 30* 09/12/2013 0401   CHOLHDL 3.6 09/12/2013 0401   VLDL 36 09/12/2013 0401   LDLCALC 42 09/12/2013 0401   LDLCALC 45 08/20/2013 0842   Imaging:  Dg Chest 2 View  09/16/2013   CLINICAL DATA:  Post CABG.  EXAM: CHEST  2 VIEW  COMPARISON:  09/15/2013.  FINDINGS: Post CABG.  Cardiomegaly.  Right central line removed.  Interval slight improved aeration left lung base with subsegmental atelectatic changes remaining.  Calcified slightly tortuous aorta.  No gross  pneumothorax or pulmonary edema.  Gas distended bowel projects left upper abdomen. It is possible this represents an ileus.  IMPRESSION: Interval improved aeration left lung base with atelectatic changes remaining.  Right central line removed.  Gas distended bowel below left hemidiaphragm.   Electronically Signed   By: Bridgett Larsson M.D.   On: 09/16/2013 08:57      Assessment/Plan:   Principal Problem:   S/P CABG x 5 Active Problems:   CAD (coronary artery disease)   Dyslipidemia   HTN (hypertension)   Obesity (BMI 30.0-34.9)   DM2 (diabetes mellitus, type 2)   Unstable angina   Ascending aorta dilatation   Atrial fibrillation, post-operative   1. CAD - Day 5 s/p CABG x5 for multivesseal CAD, continue ASA, atorvastatin, carvedilol  2.  Post op paroxysmal atrial fibrillation, maintaining NSR off Cardizem, on amiodarone load. Continue Carvedilol, continue  coumadine load, INR today 1.32  3. CHF - acute, diastolic, negative 1.6 L since yesterday, however + 15 lbs since the admission, still significantly volume overload, Getting lasix 20 mg iv bid. BP is low at 95-100 limiting more aggressive Rx presently, we will change the lasix to 20 mg iv QID  4. Hypertension - currently rather hypotensive  5. Obesity - he would benefit from post discharge rehab    Lars Masson, MD, [facility] 09/17/2013, 8:43 AM

## 2013-09-18 DIAGNOSIS — E785 Hyperlipidemia, unspecified: Secondary | ICD-10-CM

## 2013-09-18 DIAGNOSIS — I209 Angina pectoris, unspecified: Secondary | ICD-10-CM

## 2013-09-18 LAB — CBC
HCT: 35.1 % — ABNORMAL LOW (ref 39.0–52.0)
HEMOGLOBIN: 11.8 g/dL — AB (ref 13.0–17.0)
MCH: 29.4 pg (ref 26.0–34.0)
MCHC: 33.6 g/dL (ref 30.0–36.0)
MCV: 87.3 fL (ref 78.0–100.0)
Platelets: 271 10*3/uL (ref 150–400)
RBC: 4.02 MIL/uL — ABNORMAL LOW (ref 4.22–5.81)
RDW: 15.2 % (ref 11.5–15.5)
WBC: 10.2 10*3/uL (ref 4.0–10.5)

## 2013-09-18 LAB — BASIC METABOLIC PANEL
Anion gap: 14 (ref 5–15)
BUN: 25 mg/dL — ABNORMAL HIGH (ref 6–23)
CALCIUM: 9 mg/dL (ref 8.4–10.5)
CHLORIDE: 97 meq/L (ref 96–112)
CO2: 28 mEq/L (ref 19–32)
Creatinine, Ser: 1.1 mg/dL (ref 0.50–1.35)
GFR calc Af Amer: 81 mL/min — ABNORMAL LOW (ref >90)
GFR calc non Af Amer: 70 mL/min — ABNORMAL LOW (ref >90)
GLUCOSE: 119 mg/dL — AB (ref 70–99)
Potassium: 3.9 mEq/L (ref 3.7–5.3)
Sodium: 139 mEq/L (ref 137–147)

## 2013-09-18 LAB — GLUCOSE, CAPILLARY
GLUCOSE-CAPILLARY: 130 mg/dL — AB (ref 70–99)
Glucose-Capillary: 113 mg/dL — ABNORMAL HIGH (ref 70–99)
Glucose-Capillary: 133 mg/dL — ABNORMAL HIGH (ref 70–99)

## 2013-09-18 LAB — PROTIME-INR
INR: 1.47 (ref 0.00–1.49)
PROTHROMBIN TIME: 17.8 s — AB (ref 11.6–15.2)

## 2013-09-18 MED ORDER — POTASSIUM CHLORIDE CRYS ER 20 MEQ PO TBCR: 40.0000 meq | EXTENDED_RELEASE_TABLET | Freq: Once | ORAL | Status: DC

## 2013-09-18 MED ORDER — METOPROLOL TARTRATE 25 MG PO TABS
25.0000 mg | ORAL_TABLET | Freq: Two times a day (BID) | ORAL | Status: DC
  Administered 2013-09-18 – 2013-09-20 (×5): 25 mg via ORAL
  Filled 2013-09-18 (×6): qty 1

## 2013-09-18 MED ORDER — POTASSIUM CHLORIDE CRYS ER 20 MEQ PO TBCR
20.0000 meq | EXTENDED_RELEASE_TABLET | Freq: Once | ORAL | Status: AC
  Administered 2013-09-18: 20 meq via ORAL
  Filled 2013-09-18: qty 1

## 2013-09-18 MED ORDER — AMIODARONE HCL IN DEXTROSE 360-4.14 MG/200ML-% IV SOLN
60.0000 mg/h | INTRAVENOUS | Status: AC
  Administered 2013-09-18 (×2): 60 mg/h via INTRAVENOUS
  Filled 2013-09-18 (×2): qty 200

## 2013-09-18 MED ORDER — AMIODARONE LOAD VIA INFUSION
150.0000 mg | Freq: Once | INTRAVENOUS | Status: AC
  Administered 2013-09-18: 150 mg via INTRAVENOUS
  Filled 2013-09-18: qty 83.34

## 2013-09-18 MED ORDER — AMIODARONE HCL IN DEXTROSE 360-4.14 MG/200ML-% IV SOLN
30.0000 mg/h | INTRAVENOUS | Status: DC
  Administered 2013-09-18 – 2013-09-19 (×2): 30 mg/h via INTRAVENOUS
  Filled 2013-09-18 (×4): qty 200

## 2013-09-18 MED ORDER — AMIODARONE IV BOLUS ONLY 150 MG/100ML
150.0000 mg | Freq: Once | INTRAVENOUS | Status: AC
  Administered 2013-09-18: 150 mg via INTRAVENOUS
  Filled 2013-09-18: qty 100

## 2013-09-18 NOTE — Progress Notes (Signed)
Pt NSR in the 70s, spoke to pharmacy and MD to clarify Amio IV orders, MD states that she wants to continue with IV bolus and gtt and is aware pt HR is in the 70s, will monitor pt closely during adminsitration Archie Balboa, RN

## 2013-09-18 NOTE — Progress Notes (Signed)
Pt ambulated 300 feet on room air using rolling walker, pt tolerated well Archie Balboa, RN

## 2013-09-18 NOTE — Progress Notes (Signed)
CARDIAC REHAB PHASE I   PRE:  Rate/Rhythm: 73 SR  BP:  Supine:   Sitting: 95/71  Standing:    SaO2: 97%RA  MODE:  Ambulation: 550 ft   POST:  Rate/Rhythm: 81 SR  BP:  Supine:   Sitting: 129/73  Standing:    SaO2: 97%RA 1415-1442 Pt walked 550 ft on RA with rolling walker and minimal asst. Tolerated well. Remained in NSR. To recliner after walk. Pt and wife agreed to referral to [place] [facility] 2. Will refer.    Luetta Nutting, RN BSN  09/18/2013 2:37 PM

## 2013-09-18 NOTE — Progress Notes (Addendum)
      [address]       [place] [address]             [phone]        6 Days Post-Op Procedure(s) (LRB): CORONARY ARTERY BYPASS GRAFTING (CABG), on pump, times five, using left internal mammary artery, right greater saphenous vein hervested endoscopically. (N/A) INTRAOPERATIVE TRANSESOPHAGEAL ECHOCARDIOGRAM (N/A)  Subjective: Patient concerned about a fib. He hopes to be able to go home soon.  Objective: Vital signs in last 24 hours: Temp:  [97.7 F (36.5 C)-98.4 F (36.9 C)] 97.9 F (36.6 C) (09/09 0421) Pulse Rate:  [76-88] 76 (09/09 0724) Cardiac Rhythm:  [-] Normal sinus rhythm (09/09 0729) Resp:  [15-18] 18 (09/09 0125) BP: (103-122)/(64-88) 110/78 mmHg (09/09 0725) SpO2:  [92 %-97 %] 97 % (09/09 0724) Weight:  [274 lb 9.6 oz (124.558 kg)] 274 lb 9.6 oz (124.558 kg) (09/09 0421)  Pre op weight 121 kg Current Weight  09/18/13 274 lb 9.6 oz (124.558 kg)      Intake/Output from previous day: 09/08 0701 - 09/09 0700 In: 480 [P.O.:480] Out: 2125 [Urine:2125]   Physical Exam:  Cardiovascular: RRR, no murmurs, gallops, or rubs. Pulmonary: Clear to auscultation bilaterally; no rales, wheezes, or rhonchi. Abdomen: Soft, non tender, bowel sounds present. Extremities: Bilateral lower extremity edema R>L Wounds: Clean and dry.  No erythema or signs of infection.  Lab Results: CBC: Recent Labs  09/17/13 0326 09/18/13 0500  WBC 9.5 10.2  HGB 10.8* 11.8*  HCT 31.6* 35.1*  PLT 218 271   BMET:  Recent Labs  09/17/13 0326 09/18/13 0500  NA 139 139  K 4.0 3.9  CL 101 97  CO2 27 28  GLUCOSE 87 119*  BUN 25* 25*  CREATININE 0.94 1.10  CALCIUM 8.5 9.0    PT/INR:  Lab Results  Component Value Date   INR 1.47 09/18/2013   INR 1.32 09/17/2013   INR 1.21 09/16/2013   ABG:  INR: Will add last result for INR, ABG once components are confirmed Will add last 4 CBG results once components are confirmed  Assessment/Plan:  1. CV - PAF.Had a  fib with RVR (rate into 140's+) after midnight.SR in the 70's this am. On Amiodarone 400 tid,Coreg 6.25 bid, Digoxin 0.125 daily, and Coumadin. Will likely need to adjust medications if maintains SR in the 70's. INR slightly increased from 1.32 to 1.47. 2.  Pulmonary - Encourage incentive spirometer 3. Volume Overload - On Lasix 40 bid 4.  Acute blood loss anemia - H and H stable at 11.8 and 35.1 5. DM-CBGs 85/135/130. On Metformin 1000 bid. Pre op HGA1C 6.5. Will need follow up with medical doctor after discharge. 6. Supplement potassium 7. Remove EPW in am  ZIMMERMAN,DONIELLE MPA-C 09/18/2013,7:36 AM  I have seen and examined the patient and agree with the assessment and plan as outlined.  Mr Matich continues to have episodes of PAF.  Might get better rate control with metoprolol rather than carvedilol (which the patient was taking at home PTA).  I favor switching to metoprolol and stopping digoxin.  Continue amiodarone load.  Hold plans for d/c home until either PAF stops or patient tolerates episodes of PAF well w/out symptoms or elevated HR which might cause him to return to ER  [facility] 09/18/2013 8:39 AM

## 2013-09-18 NOTE — Discharge Summary (Signed)
Physician Discharge Summary       [address] [place]       [place] [address]             [phone]    Patient ID: Frank Moreno. MRN: [id number] DOB/AGE: May 10, 1950 63 y.o.  Admit date: 09/11/2013 Discharge date: 09/20/2013  Admission Diagnoses: 1. Multivessel CAD 2. History of dyslipidemia 3. History of hypertension 4. History of  obesity (BMI 30.0-34.9) 5. History of DM2 (diabetes mellitus, type 2) 6. History of thoracic aortic aneurysm  Discharge Diagnoses:  1. Multivessel CAD 2. History of dyslipidemia 3. History of hypertension 4. History of  obesity (BMI 30.0-34.9) 5. History of DM2 (diabetes mellitus, type 2) 6. History of thoracic aortic aneurysm 7.Atrial fibrillation, post-operative 8. ABL anemia   Procedure (s):  Cardiac Catheterization done by Dr. Rennis Golden: Hemodynamics:  Central Aortic Pressure / Mean Aortic Pressure: 117/75  LV Pressure / LV End diastolic Pressure: 12  Left Ventriculography:  EF: 60-65%  Wall Motion: normal  MR: 0  Coronary Angiographic Data:  Left Main: Normal  Left Anterior Descending (LAD): There is a calcified, discrete proximal stenosis that is 95%, the LAD reaches the apex and gives off small diagonal branches  1st diagonal (D1): Small caliber, no disease  2nd diagonal (D2): Small caliber  Circumflex (LCx): There is a 98% proximal to mid vessel stenosis (distal to a high OM1 takeoff)  1st obtuse marginal: High OM1 takeoff, mild 30% mid-vessel stenosis  Right Coronary Artery: Large dominant vessel. Mild luminal irregularities until the PDA/PLB junction, where there is 95% stenosis.  right ventricle branch of right coronary artery: normal  posterior descending artery: Ostial 95% stenosis.  posterior lateral branch: Ostial moderate to severe stenosis at the bifurcation  Coronary Artery Bypass Grafting x 5  Left Internal Mammary Artery to Distal Left Anterior Descending Coronary Artery  Saphenous Vein Graft to the  Posterior Descending Coronary Artery and  Sequential Saphenous Vein Graft to the Right Posterolateral Branch Coronary Artery  Saphenous Vein Graft to the Ramus Intermediate Branch and  Sequential Saphenous Vein Graft to the First Obtuse Marginal Branch of Left Circumflex Coronary Artery  Endoscopic Vein Harvest from Right Thigh and Lower Leg  Banding of Proximal Ascending Thoracic Aorta  By Dr. Cornelius Moras on 09/12/2013.  History of Presenting Illness: This is a 63 year old obese white male with known history of coronary artery disease, hypertension, hyperlipidemia, type 2 diabetes mellitus, and recently discovered thoracic aortic aneurysm who has been referred for possible surgical treatment of coronary artery disease. The patient's cardiac history dates back to 1999 when he was found to have single-vessel coronary artery disease by catheterization. He was treated medically. The patient lives a somewhat sedentary lifestyle but was otherwise in his usual state of health until May of this year when he developed severe septic shock following dental extraction of an infected tooth. He was hospitalized at [facility] in [place] for a total of 4 days and recovered. During that hospitalization he was found to have an aneurysm of the ascending thoracic aorta which the patient was told measured 4.9 cm in diameter. He was evaluated by cardiac surgeon and told that surgery was not indicated at this time. The patient was also referred to a cardiologist and apparently underwent a nuclear stress test that reportedly demonstrated resting ejection fraction 64% and no signs of significant ischemia or previous myocardial infarction. However, over the past several months the patient has experienced persistent and progressive symptoms of exertional chest discomfort  and shortness of breath. Symptoms have increased in frequency and severity, and recently have been occurring intermittently at rest and with minimal activity.  Symptoms are releived by rest or administration of sublingual nitroglycerin. The patient has had nocturnal angina that has awoken from his sleep. He denies any episodes of prolonged chest pain lasting more than 10-15 minutes. He states that he does not get short of breath unless he is having chest discomfort, but recently he has gotten to the point where he can not do much of any physical activity. He has had some dizzy spells without syncope. He has not had palpitations. He denies any history PND, orthopnea, or lower extremity edema. He was evaluated by Dr. Rennis Golden on 08/62/2015 and scheduled for elective cardiac catheterization. Cardiac catheterization performed earlier today demonstrates critical three-vessel coronary artery disease with preserved left ventricular function. Cardiothoracic surgical consultation was requested.  The patient lives with his wife in [place]. 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 He lives a sedentary lifestyle. Prior to his illness in may the patient reports no particular physical limitations. 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 He does not exercise on regular basis. In addition, the patient was told he has a thoracic aortic aneurysm during recent hospitalization. A CT angio was done and he did Dr. Cornelius Moras discussed the need for coronary artery bypass grafting surgery. Potential risks, benefits, and complications were discussed with the patient and he agreed to proceed with surgery. He underwent a CABG x 5 on 09/12/2013.  Brief Hospital Course:  The patient was extubated the evening of surgery without difficulty. He remained afebrile and hemodynamically stable. Dopamine drip was weaned off. Theone Murdoch, a line, chest tubes, and foley were removed early in the post operative course. Lopressor was started and titrated accordingly. He went into a fib with RVR. He was placed on an  Amiodarone drip. His rhythm was then PAF. He was then put on a Cardizem drip. He was put on Coumadin. His PT and INR were monitored daily. His last INR was 2.31.Digoxin was added to help with rate control. Lopressor was initially stopped and he was started on Coreg (as he had taken pre op). He eventually converted to sinus rhythm while on Lopressor, Amiodarone, and Digoxin. Digoxin was then stopped as rate was in the 70's. He was volume over loaded and diuresed. He was weaned off the insulin drip.  He was restarted on Metformin. His glucose remained well controlled. The patient's HGA1C pre op was 6.5. The patient was felt surgically stable for transfer from the ICU to [facility] for further convalescence on 09/17/2012. He continues to progress with cardiac rehab. He was ambulating on room air. He has been tolerating a diet and has had a bowel movement. Epicardial pacing wires and chest tube sutures will be removed prior to discharge. He has been seen and evaluated this morning. He is felt surgically stable for discharge home.   Latest Vital Signs: Blood pressure 99/60, pulse 79, temperature 98.6 F (37 C), temperature source Oral, resp. rate 16, height  (1.905 m), weight 264 lb 1.6 oz (119.795 kg), SpO2 96.00%.  Physical Exam: Cardiovascular: RRR, no murmurs, gallops, or rubs.  Pulmonary: Clear to auscultation bilaterally; no rales, wheezes, or rhonchi.  Abdomen: Soft, non tender, bowel sounds present.  Extremities: Bilateral lower extremity edema R>L  Wounds: Clean and dry. No erythema or signs of infection.  Discharge Condition:Stable  Recent laboratory studies:  Lab Results  Component Value Date   WBC 10.2 09/18/2013   HGB 11.8* 09/18/2013   HCT 35.1* 09/18/2013   MCV 87.3 09/18/2013   PLT 271 09/18/2013   Lab Results  Component Value Date   NA 139 09/18/2013   K 3.9 09/18/2013   CL 97 09/18/2013   CO2 28 09/18/2013   CREATININE 1.10 09/18/2013   GLUCOSE 119* 09/18/2013      Diagnostic Studies: Dg  Chest 2 View  09/16/2013   CLINICAL DATA:  Post CABG.  EXAM: CHEST  2 VIEW  COMPARISON:  09/15/2013.  FINDINGS: Post CABG.  Cardiomegaly.  Right central line removed.  Interval slight improved aeration left lung base with subsegmental atelectatic changes remaining.  Calcified slightly tortuous aorta.  No gross pneumothorax or pulmonary edema.  Gas distended bowel projects left upper abdomen. It is possible this represents an ileus.  IMPRESSION: Interval improved aeration left lung base with atelectatic changes remaining.  Right central line removed.  Gas distended bowel below left hemidiaphragm.   Electronically Signed   By: Bridgett Larsson M.D.   On: 09/16/2013 08:57   Ct Angio Chest Aortic Dissect W &/or W/o  09/11/2013   CLINICAL DATA:  Thoracic aneurysm. Evaluate for aortic dissection. Coronary artery disease.  EXAM: CT ANGIOGRAPHY CHEST WITH CONTRAST  TECHNIQUE: Multidetector CT imaging of the chest was performed using the standard protocol during bolus administration of intravenous contrast. Multiplanar CT image reconstructions and MIPs were obtained to evaluate the vascular anatomy.  CONTRAST:  OMNIPAQUE IOHEXOL 350 MG/ML SOLN  COMPARISON:  Chest x-ray 07/29/2013.  CT chest 06/27/2013.  FINDINGS: Thoracic aorta appears stable. Dilatation of the ascending aorta 4.6 cm again noted. No dissection. Pulmonary arteries are normal.Stable cardiomegaly. Coronary artery disease.  No significant mediastinal adenopathy noted. Thoracic esophagus is unremarkable.  Large airways are patent. Basilar atelectasis and tiny bilateral pleural effusion. Previously identified subpleural nodular densities are less well identified due to the basilar atelectasis present and tiny pleural effusions present.  Adrenals are normal. Simple cyst left kidney. Visualized upper abdominal structures otherwise unremarkable.  Degenerative changes lumbar spine. Punctate stable density lower vertebral body most likely bone island .  Review of  the MIP images confirms the above findings.  IMPRESSION: 1. 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 2. No evidence of aortic dissection. 3. Stable cardiomegaly.  Coronary artery disease. 4. Mild bibasilar atelectasis. Tiny bilateral effusions. Previously identified subpleural nodular densities are not well identified due to the basilar atelectasis and tiny pleural effusion present .   Electronically Signed   By: Maisie Fus  Register   On: 09/11/2013 20:49        Discharge Instructions   Amb Referral to Cardiac Rehabilitation    Complete by:  As directed   Referring to [facility] 2           Discharge Medications:   Medication List    STOP taking these medications       carvedilol 3.125 MG tablet  Commonly known as:  COREG     FELDENE 20 MG capsule  Generic drug:  piroxicam     hydrochlorothiazide 25 MG tablet  Commonly known as:  HYDRODIURIL     HYDROcodone-acetaminophen 5-325 MG per tablet  Commonly known as:  NORCO/VICODIN     nitroGLYCERIN 0.4 MG SL tablet  Commonly known as:  NITROSTAT      TAKE these medications  albuterol 108 (90 BASE) MCG/ACT inhaler  Commonly known as:  PROVENTIL HFA;VENTOLIN HFA  Inhale 1-2 puffs into the lungs every 6 (six) hours as needed for wheezing or shortness of breath.     allopurinol 300 MG tablet  Commonly known as:  ZYLOPRIM  Take 300 mg by mouth daily.     amiodarone 400 MG tablet  Commonly known as:  PACERONE  Take 1 tablet (400 mg total) by mouth 2 (two) times daily. For 5 days; then take Amiodarone 200 mg by mouth two times daily for one week. Finally, take Amiodarone 200 mg by mouth daily thereafter     aspirin 81 MG tablet  Take 81 mg by mouth daily.     furosemide 40 MG tablet  Commonly known as:  LASIX  Take 1 tablet (40 mg total) by mouth daily.     INVOKAMET 50-1000 MG Tabs  Generic drug:  Canagliflozin-Metformin HCl  Take 1 tablet by mouth 2 (two) times daily.     metoprolol tartrate 25 MG  tablet  Commonly known as:  LOPRESSOR  Take 1 tablet (25 mg total) by mouth 2 (two) times daily.     potassium chloride SA 20 MEQ tablet  Commonly known as:  K-DUR,KLOR-CON  Take 1 tablet (20 mEq total) by mouth daily.     pyridoxine 100 MG tablet  Commonly known as:  B-6  Take 200 mg by mouth daily.     sildenafil 50 MG tablet  Commonly known as:  VIAGRA  Take 50 mg by mouth as needed for erectile dysfunction.     simvastatin 80 MG tablet  Commonly known as:  ZOCOR  Take 80 mg by mouth daily.     testosterone 50 MG/5GM (1%) Gel  Commonly known as:  ANDROGEL  Place 10 g onto the skin daily.     tiZANidine 4 MG tablet  Commonly known as:  ZANAFLEX  Take 4 mg by mouth 2 (two) times daily as needed for muscle spasms.     traMADol 50 MG tablet  Commonly known as:  ULTRAM  Take 1 tablet (50 mg total) by mouth every 6 (six) hours as needed for moderate pain.     vitamin C 250 MG tablet  Commonly known as:  ASCORBIC ACID  Take 500 mg by mouth daily.     Vitamin D-3 1000 UNITS Caps  Take 4,000 Units by mouth daily.     warfarin 2.5 MG tablet  Commonly known as:  COUMADIN  Take 1 tablet (2.5 mg total) by mouth daily at 6 PM. Or as directed.       The patient has been discharged on:   1.Beta Blocker:  Yes [  x ]                              No   [   ]                              If No, reason:  2.Ace Inhibitor/ARB: Yes [   ]                                     No  [  x  ]  If No, reason:Labile BP  3.Statin:   Yes [ x  ]                  No  [   ]                  If No, reason:  4.Ecasa:  Yes  [ x  ]                  No   [   ]                  If No, reason:  Follow Up Appointments: Follow-up Information   Follow up with HILTY,Kenneth C, MD. (Please call for a follow up appointment for 2 weeks)    Specialty:  Cardiology   Contact information:   [address] [place] [address] [phone]        Follow up with Purcell Nails, MD On 10/14/2013. (PA/LAT CXR to be taken (at [facility] which is in the same building as Dr. Orvan July office) on 10/14/2013 at 1:00 pm;Appointment with Dr. Cornelius Moras is at 2:00 pm)    Specialty:  Cardiothoracic Surgery   Contact information:   [address] [place] [address] [phone]       Follow up with [facility] On 09/23/2013. (Please call for an appointment time to have PT and INR drawn on Monday 09/23/2013)    Contact information:   [address] [place] [address]       SignedDoree Fudge MPA-C 09/20/2013, 9:40 AM

## 2013-09-18 NOTE — Progress Notes (Signed)
Patient: Frank Moreno. / Admit Date: 09/11/2013 / Date of Encounter: 09/18/2013, 8:08 AM   Subjective: Feeling more tired today than yesterday. No CP or SOB, but was able to to sense tachypalps last night - it woke him up.  Objective: Telemetry: recurrence of AF RVR from ~midnight-3am, now NSR occ PACs Physical Exam: Blood pressure 110/78, pulse 76, temperature 97.9 F (36.6 C), temperature source Oral, resp. rate 18, height  (1.905 m), weight 274 lb 9.6 oz (124.558 kg), SpO2 97.00%. General: Well developed, well nourished, in no acute distress. Head: Normocephalic, atraumatic, sclera non-icteric, no xanthomas, nares are without discharge. Neck: JVP not elevated. Lungs: Clear bilaterally to auscultation without wheezes, rales, or rhonchi. Breathing is unlabored. Heart: RRR S1 S2 without murmurs, rubs, or gallops. Sternal wound without dehiscence Abdomen: Soft, non-tender, non-distended with normoactive bowel sounds. No rebound/guarding. Extremities: No clubbing or cyanosis. Mild bilateral LE edema.  Neuro: Alert and oriented X 3. Moves all extremities spontaneously. Psych:  Responds to questions appropriately with a normal affect.   Intake/Output Summary (Last 24 hours) at 09/18/13 0808 Last data filed at 09/18/13 0428  Gross per 24 hour  Intake    480 ml  Output   2125 ml  Net  -1645 ml    Inpatient Medications:  . allopurinol  300 mg Oral Daily  . amiodarone  400 mg Oral TID  . antiseptic oral rinse  7 mL Mouth Rinse q12n4p  . aspirin EC  81 mg Oral Daily  . atorvastatin  40 mg Oral q1800  . bisacodyl  10 mg Oral Daily   Or  . bisacodyl  10 mg Rectal Daily  . Canagliflozin  50 mg Oral BID WC   And  . metFORMIN  1,000 mg Oral BID WC  . carvedilol  6.25 mg Oral BID WC  . digoxin  0.125 mg Oral Daily  . docusate sodium  200 mg Oral Daily  . enoxaparin (LOVENOX) injection  40 mg Subcutaneous Q24H  . furosemide  40 mg Oral BID  . insulin aspart  0-20 Units  Subcutaneous TID WC  . insulin aspart  0-5 Units Subcutaneous QHS  . pantoprazole  40 mg Oral Daily  . potassium chloride  20 mEq Oral BID  . potassium chloride  20 mEq Oral Once  . pyridoxine  200 mg Oral Daily  . sodium chloride  3 mL Intravenous Q12H  . sodium chloride  3 mL Intravenous Q12H  . warfarin  5 mg Oral q1800  . Warfarin - Physician Dosing Inpatient   Does not apply q1800   Infusions:    Labs:  Recent Labs  09/17/13 0326 09/18/13 0500  NA 139 139  K 4.0 3.9  CL 101 97  CO2 27 28  GLUCOSE 87 119*  BUN 25* 25*  CREATININE 0.94 1.10  CALCIUM 8.5 9.0   No results found for this basename: AST, ALT, ALKPHOS, BILITOT, PROT, ALBUMIN,  in the last 72 hours  Recent Labs  09/17/13 0326 09/18/13 0500  WBC 9.5 10.2  HGB 10.8* 11.8*  HCT 31.6* 35.1*  MCV 88.8 87.3  PLT 218 271   No results found for this basename: CKTOTAL, CKMB, TROPONINI,  in the last 72 hours No components found with this basename: POCBNP,  No results found for this basename: HGBA1C,  in the last 72 hours   Radiology/Studies:  Dg Chest 2 View  09/16/2013   CLINICAL DATA:  Post CABG.  EXAM: CHEST  2 VIEW  COMPARISON:  09/15/2013.  FINDINGS: Post CABG.  Cardiomegaly.  Right central line removed.  Interval slight improved aeration left lung base with subsegmental atelectatic changes remaining.  Calcified slightly tortuous aorta.  No gross pneumothorax or pulmonary edema.  Gas distended bowel projects left upper abdomen. It is possible this represents an ileus.  IMPRESSION: Interval improved aeration left lung base with atelectatic changes remaining.  Right central line removed.  Gas distended bowel below left hemidiaphragm.   Electronically Signed   By: Bridgett Larsson M.D.   On: 09/16/2013 08:57   Dg Chest 2 View  09/05/2013   CLINICAL DATA:  Preop for cardiac catheterization  EXAM: CHEST  2 VIEW  COMPARISON:  06/27/2013  FINDINGS: Cardiomediastinal silhouette is stable. No acute infiltrate or pleural  effusion. No pulmonary edema. Mild degenerative changes thoracic spine.  IMPRESSION: No active cardiopulmonary disease.   Electronically Signed   By: Natasha Mead M.D.   On: 09/05/2013 09:18   Dg Chest Port 1 View  09/15/2013   CLINICAL DATA:  Prior CABG.  EXAM: PORTABLE CHEST - 1 VIEW  COMPARISON:  One day prior  FINDINGS: Right IJ Cordis sheath unchanged. Cardiomegaly accentuated by AP portable technique. A small layering left pleural effusion is not significantly changed. No pneumothorax. Low lung volumes with resultant pulmonary interstitial prominence. Clear right lung. Improved left lower lobe predominant atelectasis.  IMPRESSION: Minimal improvement in left-sided aeration with persistent layering left pleural effusion and adjacent atelectasis.   Electronically Signed   By: Jeronimo Greaves M.D.   On: 09/15/2013 07:53   Dg Chest Port 1 View  09/14/2013   CLINICAL DATA:  Postop cardiac surgery  EXAM: PORTABLE CHEST - 1 VIEW  COMPARISON:  09/13/2013  FINDINGS: Interval removal of left chest tube, mediastinal drain, and right IJ Swan-Ganz catheter.  Stable right IJ venous sheath.  Layering small left pleural effusion.  No pneumothorax.  Heart is top-normal in size. Postsurgical changes related to prior CABG.  IMPRESSION: Layering small left pleural effusion.  Postsurgical changes related to prior CABG.  No pneumothorax.   Electronically Signed   By: Charline Bills M.D.   On: 09/14/2013 08:09   Dg Chest Portable 1 View In Am  09/13/2013   CLINICAL DATA:  postop CABG  EXAM: PORTABLE CHEST - 1 VIEW  COMPARISON:  09/12/2013  FINDINGS: Interval extubation. Right IJ Swan-Ganz catheter tip projects over the right pulmonary artery. Mediastinal drains in place. Left-sided pleural drain in place. Small left pleural effusion and associated airspace opacity. Right lung predominantly clear. No definite pneumothorax. Cardiomediastinal contours and change. Status post median sternotomy.  IMPRESSION: Interval extubation.   Otherwise, unchanged support devices as above.  Left pleural effusion and associated consolidation, favor atelectasis.   Electronically Signed   By: Jearld Lesch M.D.   On: 09/13/2013 03:28   Dg Chest Port 1 View  09/12/2013   CLINICAL DATA:  Postop CABG  EXAM: PORTABLE CHEST - 1 VIEW  COMPARISON:  09/05/2013  FINDINGS: Endotracheal tube ends at the clavicular heads. The carina is not well discerned. Nasogastric tube enters the stomach. Right IJ Swan catheter, tip near the interlobar pulmonary artery. Thoracic drains present. No pneumothorax. New baseline size and morphology of the cardiomediastinal silhouette status post CABG. No gross enlargement concerning for hematoma. Retrocardiac opacification, postoperative atelectasis given the clinical circumstances.  IMPRESSION: 1. Tubes and lines in unremarkable position, as above. 2. Postoperative retrocardiac atelectasis. 3. No edema or pneumothorax.   Electronically Signed   By: Audry Riles.D.  On: 09/12/2013 15:37   Ct Angio Chest Aortic Dissect W &/or W/o  09/11/2013   CLINICAL DATA:  Thoracic aneurysm. Evaluate for aortic dissection. Coronary artery disease.  EXAM: CT ANGIOGRAPHY CHEST WITH CONTRAST  TECHNIQUE: Multidetector CT imaging of the chest was performed using the standard protocol during bolus administration of intravenous contrast. Multiplanar CT image reconstructions and MIPs were obtained to evaluate the vascular anatomy.  CONTRAST:  OMNIPAQUE IOHEXOL 350 MG/ML SOLN  COMPARISON:  Chest x-ray 07/29/2013.  CT chest 06/27/2013.  FINDINGS: Thoracic aorta appears stable. Dilatation of the ascending aorta 4.6 cm again noted. No dissection. Pulmonary arteries are normal.Stable cardiomegaly. Coronary artery disease.  No significant mediastinal adenopathy noted. Thoracic esophagus is unremarkable.  Large airways are patent. Basilar atelectasis and tiny bilateral pleural effusion. Previously identified subpleural nodular densities are less  well identified due to the basilar atelectasis present and tiny pleural effusions present.  Adrenals are normal. Simple cyst left kidney. Visualized upper abdominal structures otherwise unremarkable.  Degenerative changes lumbar spine. Punctate stable density lower vertebral body most likely bone island .  Review of the MIP images confirms the above findings.  IMPRESSION: 1. 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 2. No evidence of aortic dissection. 3. Stable cardiomegaly.  Coronary artery disease. 4. Mild bibasilar atelectasis. Tiny bilateral effusions. Previously identified subpleural nodular densities are not well identified due to the basilar atelectasis and tiny pleural effusion present .   Electronically Signed   By: Maisie Fus  Register   On: 09/11/2013 20:49     Assessment and Plan  1. 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 Post-op AF 3. Acute diastolic CHF post-operatively 4. HTN with soft BP's recently 5. Obesity Body mass index is 34.32 kg/(m^2). 6. HTN 7. DM  Now on oral Lasix as of yesterday, continues to diurese well. Wt 274; was 269 on admission. Once he is back down, would consider maybe changing to Lasix  daily since he was not on Lasix at home. Had recurrence of AF overnight. Will give amio load a little more time to settle in (started 9/4). Will discuss longer-term plans for digoxin with MD given concomitant amiodarone use. We might think about changing Coreg to metoprolol for improved rate control during RVR episodes although this was changed to Coreg recently to more closely coincide with home regimen. EF preserved. ? Obtain echo while he is here. He is also undergoing Coumadin load.   Signed, Ronie Spies PA-C  The patient was seen, examined and discussed with Ronie Spies, PA-C and I agree with the above.   The patient had another episode of rapid atrial fibrillation the last night that  terminated at 3 am. We will continue loading with iv amiodarone while the patient is inpatient. We will switch to PO amiodarone tomorrow. We will switch carvedilol to metoprolol 25 mg po BID and uptitrate if tolerated by BP. Hold Digoxin for now. INR 1.47 today.  He is again negative 1.6 Liters and today's weight 274 lbs (on admission 269 lbs), Crea 0.94 --> 1.1. We will continue to monitor, and for now continue Lasix 40 mg po BID.   Lars Masson 09/18/2013

## 2013-09-18 NOTE — Discharge Instructions (Signed)
Radial Site Care  Refer to this sheet in the next few weeks. These instructions provide you with information on caring for yourself after your procedure. Your caregiver may also give you more specific instructions. Your treatment has been planned according to current medical practices, but problems sometimes occur. Call your caregiver if you have any problems or questions after your procedure. HOME CARE INSTRUCTIONS  You may shower the day after the procedure.Remove the bandage (dressing) and gently wash the site with plain soap and water.Gently pat the site dry.  Do not apply powder or lotion to the site.  Do not submerge the affected site in water for 3 to 5 days.  Inspect the site at least twice daily.  Do not flex or bend the affected arm for 24 hours.  No lifting over 5 pounds (2.3 kg) for 5 days after your procedure.  Do not drive home if you are discharged the same day of the procedure. Have someone else drive you.  You may drive 24 hours after the procedure unless otherwise instructed by your caregiver.  Do not operate machinery or power tools for 24 hours.  A responsible adult should be with you for the first 24 hours after you arrive home. What to expect:  Any bruising will usually fade within 1 to 2 weeks.  Blood that collects in the tissue (hematoma) may be painful to the touch. It should usually decrease in size and tenderness within 1 to 2 weeks. SEEK IMMEDIATE MEDICAL CARE IF:  You have unusual pain at the radial site.  You have redness, warmth, swelling, or pain at the radial site.  You have drainage (other than a small amount of blood on the dressing).  You have chills.  You have a fever or persistent symptoms for more than 72 hours.  You have a fever and your symptoms suddenly get worse.  Your arm becomes pale, cool, tingly, or numb.  You have heavy bleeding from the site. Hold pressure on the site. Document Released: 01/29/2010 Document Revised:  03/21/2011 Document Reviewed: 01/29/2010 [facility] Patient Information 2015 [place], [place]. This information is not intended to replace advice given to you by your health care provider. Make sure you discuss any questions you have with your health care provider.  Warfarin: What You Need to Know Warfarin is an anticoagulant. Anticoagulants help prevent the formation of blood clots. They also help stop the growth of blood clots. Warfarin is sometimes referred to as a "blood thinner."  Normally, when body tissues are cut or damaged, the blood clots in order to prevent blood loss. Sometimes clots form inside your blood vessels and obstruct the flow of blood through your circulatory system (thrombosis). These clots may travel through your bloodstream and become lodged in smaller blood vessels in your brain, which can cause a stroke, or in your lungs (pulmonary embolism). WHO SHOULD USE WARFARIN? Warfarin is prescribed for people at risk of developing harmful blood clots:  People with surgically implanted mechanical heart valves, irregular heart rhythms called atrial fibrillation, and certain clotting disorders.  People who have developed harmful blood clotting in the past, including those who have had a stroke or a pulmonary embolism, or thrombosis in their legs (deep vein thrombosis [DVT]).  People with an existing blood clot, such as a pulmonary embolism. WARFARIN DOSING Warfarin tablets come in different strengths. Each tablet strength is a different color, with the amount of warfarin (in milligrams) clearly printed on the tablet. If the color of your tablet is  different than usual when you receive a new prescription, report it immediately to your pharmacist or health care provider. WARFARIN MONITORING The goal of warfarin therapy is to lessen the clotting tendency of blood but not prevent clotting completely. Your health care provider will monitor the anticoagulation effect of warfarin closely and  adjust your dose as needed. For your safety, blood tests called prothrombin time (PT) or international normalized ratio (INR) are used to measure the effects of warfarin. Both of these tests can be done with a finger stick or a blood draw. The longer it takes the blood to clot, the higher the PT or INR. Your health care provider will inform you of your "target" PT or INR range. If, at any time, your PT or INR is above the target range, there is a risk of bleeding. If your PT or INR is below the target range, there is a risk of clotting. Whether you are started on warfarin while you are in the hospital or in your health care provider's office, you will need to have your PT or INR checked within one week of starting the medicine. Initially, some people are asked to have their PT or INR checked as much as twice a week. Once you are on a stable maintenance dose, the PT or INR is checked less often, usually once every 2 to 4 weeks. The warfarin dose may be adjusted if the PT or INR is not within the target range. It is important to keep all laboratory and health care provider follow-up appointments. Not keeping appointments could result in a chronic or permanent injury, pain, or disability because warfarin is a medicine that requires close monitoring. WHAT ARE THE SIDE EFFECTS OF WARFARIN?  Too much warfarin can cause bleeding (hemorrhage) from any part of the body. This may include bleeding from the gums, blood in the urine, bloody or dark stools, a nosebleed that is not easily stopped, coughing up blood, or vomiting blood.  Too little warfarin can increase the risk of blood clots.  Too little or too much warfarin can also increase the risk of a stroke.  Warfarin use may cause a skin rash or irritation, an unusual fever, continual nausea or stomach upset, or severe pain in your joints or back. SPECIAL PRECAUTIONS WHILE TAKING WARFARIN Warfarin should be taken exactly as directed. It is very important to  take warfarin as directed since bleeding or blood clots could result in chronic or permanent injury, pain, or disability.  Take your medicine at the same time every day. If you forget to take your dose, you can take it if it is within 6 hours of when it was due.  Do not change the dose of warfarin on your own to make up for missed or extra doses.  If you miss more than 2 doses in a row, you should contact your health care provider for advice. Avoid situations that cause bleeding. You may have a tendency to bleed more easily than usual while taking warfarin. The following actions can limit bleeding:  Using a softer toothbrush.  Flossing with waxed floss rather than unwaxed floss.  Shaving with an Neurosurgeon rather than a blade.  Limiting the use of sharp objects.  Avoiding potentially harmful activities, such as contact sports. Warfarin and Pregnancy or Breastfeeding  Warfarin is not advised during the first trimester of pregnancy due to an increased risk of birth defects. In certain situations, a woman may take warfarin after her first trimester of pregnancy.  A woman who becomes pregnant or plans to become pregnant while taking warfarin should notify her health care provider immediately.  Although warfarin does not pass into breast milk, a woman who wishes to breastfeed while taking warfarin should also consult with her health care provider. Alcohol, Smoking, and Illicit Drug Use  Alcohol affects how warfarin works in the body. It is best to avoid alcoholic drinks or consume very small amounts while taking warfarin. In general, alcohol intake should be limited to 1 oz (30 mL) of liquor, 6 oz (180 mL) of wine, or 12 oz (360 mL) of beer each day. Notify your health care provider if you change your alcohol intake.  Smoking affects how warfarin works. It is best to avoid smoking while taking warfarin. Notify your health care provider if you change your smoking habits.  It is best to  avoid all illicit drugs while taking warfarin since there are few studies that show how warfarin interacts with these drugs. Other Medicines and Dietary Supplements Many prescription and over-the-counter medicines can interfere with warfarin. Be sure all of your health care providers know you are taking warfarin. Notify your health care provider who prescribed warfarin for you or your pharmacist before starting or stopping any new medicines, including over-the-counter vitamins, dietary supplements, and pain medicines. Your warfarin dose may need to be adjusted. Some common over-the-counter medicines that may increase the risk of bleeding while taking warfarin include:   Acetaminophen.  Aspirin.  Nonsteroidal anti-inflammatory medicines (NSAIDs), such as ibuprofen or naproxen.  Vitamin E. Dietary Considerations  Foods that have moderate or high amounts of vitamin K can interfere with warfarin. Avoid major changes in your diet or notify your health care provider before changing your diet. Eat a consistent amount of foods that have moderate or high amounts of vitamin K. Eating less foods containing vitamin K can increase the risk of bleeding. Eating more foods containing vitamin K can increase the risk of blood clots. Additional questions about dietary considerations can be discussed with a dietitian. Foods that are very high in vitamin K:  Greens, such as Swiss chard and beet, collard, mustard, or turnip greens (fresh or frozen, cooked).  Kale (fresh or frozen, cooked).  Parsley (raw).  Spinach (cooked). Foods that are high in vitamin K:  Asparagus (frozen, cooked).  Beans, green (frozen, cooked).  Broccoli.  Bok choy (cooked).  Brussels sprouts (fresh or frozen, cooked).  Cabbage (cooked).   Coleslaw. Foods that are moderately high in vitamin K:  Blueberries.  Black-eyed peas.  Endive (raw).  Green leaf lettuce (raw).  Green scallions (raw).  Kale (raw).  Okra  (frozen, cooked).  Plantains (fried).  Romaine lettuce (raw).  Sauerkraut (canned).  Spinach (raw). CALL YOUR CLINIC OR HEALTH CARE PROVIDER IF YOU:  Plan to have any surgery or procedure.  Feel sick, especially if you have diarrhea or vomiting.  Experience or anticipate any major changes in your diet.  Start or stop a prescription or over-the-counter medicine.  Become, plan to become, or think you may be pregnant.  Are having heavier than usual menstrual periods.  Have had a fall, accident, or any symptoms of bleeding or unusual bruising.  Develop an unusual fever. CALL 911 IN THE [place] OR GO TO THE EMERGENCY DEPARTMENT IF YOU:   Think you may be having an allergic reaction to warfarin. The signs of an allergic reaction could include itching, rash, hives, swelling, chest tightness, or trouble breathing.  See signs of blood in your urine.  The signs could include reddish, pinkish, or tea-colored urine.  See signs of blood in your stools. The signs could include bright red or black stools.  Vomit or cough up blood. In these instances, the blood could have either a bright red or a "coffee-grounds" appearance.  Have bleeding that will not stop after applying pressure for 30 minutes such as cuts, nosebleeds, or other injuries.  Have severe pain in your joints or back.  Have a new and severe headache.  Have sudden weakness or numbness of your face, arm, or leg, especially on one side of your body.  Have sudden confusion or trouble understanding.  Have sudden trouble seeing in one or both eyes.  Have sudden trouble walking, dizziness, loss of balance, or coordination.  Have trouble speaking or understanding (aphasia). Document Released: 12/27/2004 Document Revised: 05/13/2013 Document Reviewed: 06/22/2012 [facility] Patient Information 2015 [place], [place]. This information is not intended to replace advice given to you by your health care provider. Make sure you discuss any  questions you have with your health care provider.  Coronary Artery Bypass Grafting, Care After Refer to this sheet in the next few weeks. These instructions provide you with information on caring for yourself after your procedure. Your health care provider may also give you more specific instructions. Your treatment has been planned according to current medical practices, but problems sometimes occur. Call your health care provider if you have any problems or questions after your procedure. WHAT TO EXPECT AFTER THE PROCEDURE Recovery from surgery will be different for everyone. Some people feel well after 3 or 4 weeks, while for others it takes longer. After your procedure, it is typical to have the following:  Nausea and a lack of appetite.   Constipation.  Weakness and fatigue.   Depression or irritability.   Pain or discomfort at your incision site. HOME CARE INSTRUCTIONS  Take medicines only as directed by your health care provider. Do not stop taking medicines or start any new medicines without first checking with your health care provider.  Take your pulse as directed by your health care provider.  Perform deep breathing as directed by your health care provider. If you were given a device called an incentive spirometer, use it to practice deep breathing several times a day. Support your chest with a pillow or your arms when you take deep breaths or cough.  Keep incision areas clean, dry, and protected. Remove or change any bandages (dressings) only as directed by your health care provider. You may have skin adhesive strips over the incision areas. Do not take the strips off. They will fall off on their own.  Check incision areas daily for any swelling, redness, or drainage.  If incisions were made in your legs, do the following:  Avoid crossing your legs.   Avoid sitting for long periods of time. Change positions every 30 minutes.   Elevate your legs when you are  sitting.  Wear compression stockings as directed by your health care provider. These stockings help keep blood clots from forming in your legs.  Take showers once your health care provider approves. Until then, only take sponge baths. Pat incisions dry. Do not rub incisions with a washcloth or towel. Do not take baths, swim, or use a hot tub until your health care provider approves.  Eat foods that are high in fiber, such as raw fruits and vegetables, whole grains, beans, and nuts. Meats should be lean cut. Avoid canned, processed, and fried foods.  Drink enough fluid to keep your urine clear or pale yellow.  Weigh yourself every day. This helps identify if you are retaining fluid that may make your heart and lungs work harder.  Rest and limit activity as directed by your health care provider. You may be instructed to:  Stop any activity at once if you have chest pain, shortness of breath, irregular heartbeats, or dizziness. Get help right away if you have any of these symptoms.  Move around frequently for short periods or take short walks as directed by your health care provider. Increase your activities gradually. You may need physical therapy or cardiac rehabilitation to help strengthen your muscles and build your endurance.  Avoid lifting, pushing, or pulling anything heavier than 10 lb (4.5 kg) for at least 6 weeks after surgery.  Do not drive until your health care provider approves.  Ask your health care provider when you may return to work.  Ask your health care provider when you may resume sexual activity.  Keep all follow-up visits as directed by your health care provider. This is important. SEEK MEDICAL CARE IF:  You have swelling, redness, increasing pain, or drainage at the site of an incision.  You have a fever.  You have swelling in your ankles or legs.  You have pain in your legs.   You gain 2 or more pounds (0.9 kg) a day.  You are nauseous or vomit.  You  have diarrhea. SEEK IMMEDIATE MEDICAL CARE IF:  You have chest pain that goes to your jaw or arms.  You have shortness of breath.   You have a fast or irregular heartbeat.   You notice a "clicking" in your breastbone (sternum) when you move.   You have numbness or weakness in your arms or legs.  You feel dizzy or light-headed.  MAKE SURE YOU:  Understand these instructions.  Will watch your condition.  Will get help right away if you are not doing well or get worse. Document Released: 07/16/2004 Document Revised: 05/13/2013 Document Reviewed: 06/05/2012 [facility] Patient Information 2015 [place], [place]. This information is not intended to replace advice given to you by your health care provider. Make sure you discuss any questions you have with your health care provider.  ____________________________________  Information on my medicine - Coumadin   (Warfarin)  This medication education was reviewed with me or my healthcare representative as part of my discharge preparation.  The pharmacist that spoke with me during my hospital stay was:  Lucia Gaskins, [facility]  Why was Coumadin prescribed for you? Coumadin was prescribed for you because you have a blood clot or a medical condition that can cause an increased risk of forming blood clots. Blood clots can cause serious health problems by blocking the flow of blood to the heart, lung, or brain. Coumadin can prevent harmful blood clots from forming. As a reminder your indication for Coumadin is:   Stroke Prevention Because Of Atrial Fibrillation  What test will check on my response to Coumadin? While on Coumadin (warfarin) you will need to have an INR test regularly to ensure that your dose is keeping you in the desired range. The INR (international normalized ratio) number is calculated from the result of the laboratory test called prothrombin time (PT).  If an INR APPOINTMENT HAS NOT ALREADY BEEN MADE FOR YOU please schedule an  appointment to have this lab work done by your health care provider within 7 days. Your INR goal is usually a number between:  2 to 3 or your provider may give you a more narrow range like 2-2.5.  Ask your health care provider during an office visit what your goal INR is.  What  do you need to  know  About  COUMADIN? Take Coumadin (warfarin) exactly as prescribed by your healthcare provider about the same time each day.  DO NOT stop taking without talking to the doctor who prescribed the medication.  Stopping without other blood clot prevention medication to take the place of Coumadin may increase your risk of developing a new clot or stroke.  Get refills before you run out.  What do you do if you miss a dose? If you miss a dose, take it as soon as you remember on the same day then continue your regularly scheduled regimen the next day.  Do not take two doses of Coumadin at the same time.  Important Safety Information A possible side effect of Coumadin (Warfarin) is an increased risk of bleeding. You should call your healthcare provider right away if you experience any of the following:   Bleeding from an injury or your nose that does not stop.   Unusual colored urine (red or dark brown) or unusual colored stools (red or black).   Unusual bruising for unknown reasons.   A serious fall or if you hit your head (even if there is no bleeding).  Some foods or medicines interact with Coumadin (warfarin) and might alter your response to warfarin. To help avoid this:   Eat a balanced diet, maintaining a consistent amount of Vitamin K.   Notify your provider about major diet changes you plan to make.   Avoid alcohol or limit your intake to 1 drink for women and 2 drinks for men per day. (1 drink is 5 oz. wine, 12 oz. beer, or 1.5 oz. liquor.)  Make sure that ANY health care provider who prescribes medication for you knows that you are taking Coumadin (warfarin).  Also make sure the healthcare provider  who is monitoring your Coumadin knows when you have started a new medication including herbals and non-prescription products.  Coumadin (Warfarin)  Major Drug Interactions  Increased Warfarin Effect Decreased Warfarin Effect  Alcohol (large quantities) Antibiotics (esp. Septra/Bactrim, Flagyl, Cipro) Amiodarone (Cordarone) Aspirin (ASA) Cimetidine (Tagamet) Megestrol (Megace) NSAIDs (ibuprofen, naproxen, etc.) Piroxicam (Feldene) Propafenone (Rythmol SR) Propranolol (Inderal) Isoniazid (INH) Posaconazole (Noxafil) Barbiturates (Phenobarbital) Carbamazepine (Tegretol) Chlordiazepoxide (Librium) Cholestyramine (Questran) Griseofulvin Oral Contraceptives Rifampin Sucralfate (Carafate) Vitamin K   Coumadin (Warfarin) Major Herbal Interactions  Increased Warfarin Effect Decreased Warfarin Effect  Garlic Ginseng Ginkgo biloba Coenzyme Q10 Green tea St. Johns wort    Coumadin (Warfarin) FOOD Interactions  Eat a consistent number of servings per week of foods HIGH in Vitamin K (1 serving =  cup)  Collards (cooked, or boiled & drained) Kale (cooked, or boiled & drained) Mustard greens (cooked, or boiled & drained) Parsley *serving size only =  cup Spinach (cooked, or boiled & drained) Swiss chard (cooked, or boiled & drained) Turnip greens (cooked, or boiled & drained)  Eat a consistent number of servings per week of foods MEDIUM-HIGH in Vitamin K (1 serving = 1 cup)  Asparagus (cooked, or boiled & drained) Broccoli (cooked, boiled & drained, or raw & chopped) Brussel sprouts (cooked, or boiled & drained) *serving size only =  cup Lettuce, raw (green leaf, endive, romaine) Spinach, raw Turnip greens, raw & chopped   These websites have more information on Coumadin (warfarin):  http://www.king-russell.com/; https://www.hines.net/;

## 2013-09-18 NOTE — Progress Notes (Signed)
Pacing wires removed per order and protocol, wires intact upon removal, pt made aware of bedrest for 1 hour and stated understanding, will continue to monitor closely Archie Balboa, RN

## 2013-09-19 LAB — GLUCOSE, CAPILLARY
GLUCOSE-CAPILLARY: 103 mg/dL — AB (ref 70–99)
GLUCOSE-CAPILLARY: 166 mg/dL — AB (ref 70–99)
Glucose-Capillary: 116 mg/dL — ABNORMAL HIGH (ref 70–99)
Glucose-Capillary: 136 mg/dL — ABNORMAL HIGH (ref 70–99)
Glucose-Capillary: 95 mg/dL (ref 70–99)
Glucose-Capillary: 95 mg/dL (ref 70–99)

## 2013-09-19 LAB — PROTIME-INR
INR: 2.01 — ABNORMAL HIGH (ref 0.00–1.49)
Prothrombin Time: 22.8 seconds — ABNORMAL HIGH (ref 11.6–15.2)

## 2013-09-19 MED ORDER — FUROSEMIDE 40 MG PO TABS
40.0000 mg | ORAL_TABLET | Freq: Every day | ORAL | Status: DC
  Administered 2013-09-20: 40 mg via ORAL
  Filled 2013-09-19: qty 1

## 2013-09-19 MED ORDER — AMIODARONE HCL 200 MG PO TABS
400.0000 mg | ORAL_TABLET | Freq: Two times a day (BID) | ORAL | Status: DC
  Administered 2013-09-19 – 2013-09-20 (×3): 400 mg via ORAL
  Filled 2013-09-19 (×5): qty 2

## 2013-09-19 MED ORDER — WARFARIN SODIUM 2.5 MG PO TABS
2.5000 mg | ORAL_TABLET | Freq: Every day | ORAL | Status: DC
  Administered 2013-09-19: 2.5 mg via ORAL
  Filled 2013-09-19 (×2): qty 1

## 2013-09-19 NOTE — Progress Notes (Addendum)
      [address]       [place] [address]             [phone]        7 Days Post-Op Procedure(s) (LRB): CORONARY ARTERY BYPASS GRAFTING (CABG), on pump, times five, using left internal mammary artery, right greater saphenous vein hervested endoscopically. (N/A) INTRAOPERATIVE TRANSESOPHAGEAL ECHOCARDIOGRAM (N/A)  Subjective: Patient walked a fair amount yesterday. Has fatigued, but slowly feeling better  Objective: Vital signs in last 24 hours: Temp:  [97.7 F (36.5 C)-98.5 F (36.9 C)] 98.5 F (36.9 C) (09/10 0621) Pulse Rate:  [74-89] 76 (09/10 0621) Cardiac Rhythm:  [-] Normal sinus rhythm (09/09 2100) Resp:  [18] 18 (09/10 0621) BP: (100-134)/(73-79) 121/77 mmHg (09/10 0621) SpO2:  [93 %-97 %] 93 % (09/10 0621) Weight:  [268 lb 8 oz (121.791 kg)] 268 lb 8 oz (121.791 kg) (09/10 0621)  Pre op weight 121 kg Current Weight  09/19/13 268 lb 8 oz (121.791 kg)      Intake/Output from previous day: 09/09 0701 - 09/10 0700 In: 240 [P.O.:240] Out: 1000 [Urine:1000]   Physical Exam:  Cardiovascular: RRR, no murmurs, gallops, or rubs. Pulmonary: Clear to auscultation bilaterally; no rales, wheezes, or rhonchi. Abdomen: Soft, non tender, bowel sounds present. Extremities: Bilateral lower extremity edema R>L Wounds: Clean and dry.  No erythema or signs of infection.  Lab Results: CBC:  Recent Labs  09/17/13 0326 09/18/13 0500  WBC 9.5 10.2  HGB 10.8* 11.8*  HCT 31.6* 35.1*  PLT 218 271   BMET:   Recent Labs  09/17/13 0326 09/18/13 0500  NA 139 139  K 4.0 3.9  CL 101 97  CO2 27 28  GLUCOSE 87 119*  BUN 25* 25*  CREATININE 0.94 1.10  CALCIUM 8.5 9.0    PT/INR:  Lab Results  Component Value Date   INR 2.01* 09/19/2013   INR 1.47 09/18/2013   INR 1.32 09/17/2013   ABG:  INR: Will add last result for INR, ABG once components are confirmed Will add last 4 CBG results once components are confirmed  Assessment/Plan:  1. CV -  PAF.Reviewed monitor for last evening. Had PVCs but not much PAF.SR in the 80's this am. On Amiodarone drip, Lopressor 25 bid, and Coumadin. Likely stop Amiodarone drip and start oral.INR slightly increased from 1.47 to 2.01. Low dose Coumadin tonight. 2.  Pulmonary - Encourage incentive spirometer 3. Volume Overload - On Lasix 40 bid. Continues to diurese well. Will decrease to daily after today 4.  Acute blood loss anemia - H and H stable at 11.8 and 35.1 5. DM-CBGs 95/116/103. On Metformin 1000 bid. Pre op HGA1C 6.5.  6. Will be ready for discharge once PAF is controlled, possibly in am  ZIMMERMAN,DONIELLE MPA-C 09/19/2013,7:27 AM    I have seen and examined the patient and agree with the assessment and plan as outlined.  Now therapeutic on coumadin - will stop Lovenox.  Decrease coumadin to 2.5 mg/day.    OWEN,CLARENCE H 09/19/2013 8:10 AM

## 2013-09-19 NOTE — Progress Notes (Signed)
Pt ambulated 650 feet on room air without walker. He tolerated it well  Frank Moreno 5:58 PM

## 2013-09-19 NOTE — Progress Notes (Signed)
Pt ambulating hall with wife.  HR 90's-110's with occasional PVC's and some irregularity.  Will con't to monitor.

## 2013-09-19 NOTE — Progress Notes (Signed)
Patient: Rodell Perna. / Admit Date: 09/11/2013 / Date of Encounter: 09/19/2013, 8:45 AM  Subjective: Walked yesterday without significant difficulties. He feel much better.   Objective: Telemetry: In NSR, frequent PACs, no a-fib.  Physical Exam: Blood pressure 121/77, pulse 76, temperature 98.5 F (36.9 C), temperature source Oral, resp. rate 18, height  (1.905 m), weight 268 lb 8 oz (121.791 kg), SpO2 93.00%. General: Well developed, well nourished, in no acute distress. Head: Normocephalic, atraumatic, sclera non-icteric, no xanthomas, nares are without discharge. Neck: JVP not elevated. Lungs: Clear bilaterally to auscultation without wheezes, rales, or rhonchi. Breathing is unlabored. Heart: RRR S1 S2 without murmurs, rubs, or gallops. Sternal wound without dehiscence Abdomen: Soft, non-tender, non-distended with normoactive bowel sounds. No rebound/guarding. Extremities: No clubbing or cyanosis. Mild bilateral LE edema.  Neuro: Alert and oriented X 3. Moves all extremities spontaneously. Psych:  Responds to questions appropriately with a normal affect.  Intake/Output Summary (Last 24 hours) at 09/19/13 0845 Last data filed at 09/18/13 1431  Gross per 24 hour  Intake    240 ml  Output   1000 ml  Net   -760 ml   Inpatient Medications:   . allopurinol  300 mg Oral Daily  . amiodarone  400 mg Oral BID  . antiseptic oral rinse  7 mL Mouth Rinse q12n4p  . aspirin EC  81 mg Oral Daily  . atorvastatin  40 mg Oral q1800  . bisacodyl  10 mg Oral Daily   Or  . bisacodyl  10 mg Rectal Daily  . Canagliflozin  50 mg Oral BID WC   And  . metFORMIN  1,000 mg Oral BID WC  . docusate sodium  200 mg Oral Daily  . furosemide  40 mg Oral BID  . insulin aspart  0-20 Units Subcutaneous TID WC  . insulin aspart  0-5 Units Subcutaneous QHS  . metoprolol tartrate  25 mg Oral BID  . pantoprazole  40 mg Oral Daily  . potassium chloride  20 mEq Oral BID  . pyridoxine  200 mg Oral  Daily  . sodium chloride  3 mL Intravenous Q12H  . sodium chloride  3 mL Intravenous Q12H  . warfarin  2.5 mg Oral q1800  . Warfarin - Physician Dosing Inpatient   Does not apply q1800   Infusions:  . amiodarone 30 mg/hr (09/19/13 0235)   Labs:  Recent Labs  09/17/13 0326 09/18/13 0500  NA 139 139  K 4.0 3.9  CL 101 97  CO2 27 28  GLUCOSE 87 119*  BUN 25* 25*  CREATININE 0.94 1.10  CALCIUM 8.5 9.0    Recent Labs  09/17/13 0326 09/18/13 0500  WBC 9.5 10.2  HGB 10.8* 11.8*  HCT 31.6* 35.1*  MCV 88.8 87.3  PLT 218 271   Radiology/Studies:   Dg Chest 2 View  09/16/2013   CLINICAL DATA:  Post CABG.  EXAM: CHEST  2 VIEW  COMPARISON:  09/15/2013.  FINDINGS: Post CABG.  Cardiomegaly.  Right central line removed.  Interval slight improved aeration left lung base with subsegmental atelectatic changes remaining.  Calcified slightly tortuous aorta.  No gross pneumothorax or pulmonary edema.  Gas distended bowel projects left upper abdomen. It is possible this represents an ileus.  IMPRESSION: Interval improved aeration left lung base with atelectatic changes remaining.  Right central line removed.  Gas distended bowel below left hemidiaphragm.   Electronically Signed   By: Bridgett Larsson M.D.   On: 09/16/2013 08:57  Assessment and Plan   1. 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 Post-op AF 3. Acute diastolic CHF post-operatively 4. HTN with soft BP's recently 5. Obesity Body mass index is 34.32 kg/(m^2). 6. HTN 7. DM  Now on oral Lasix as of yesterday, continues to diurese well. Wt 268; was 269 on admission.  I would cut down to Lasix 40 mg po daily. Discontinue iv amiodarone, start amiodarone 400 mg po BID for another week, then 400 mg po daily for a week, followed by 200 mg po daily long term. Plan to follow in our clinic within the next 2 weeks.  Continue metoprolol to 25 mg po BID. INR  2.0. Anticipated discharge tomorrow.   Lars Masson 09/19/2013

## 2013-09-19 NOTE — Progress Notes (Signed)
CARDIAC REHAB PHASE I   PRE:  Rate/Rhythm: 84 SR with many PACs    BP: sitting 117/91    SaO2: 96 RA  MODE:  Ambulation: 690 ft   POST:  Rate/Rhythm: 90 SR with increased PACs    BP: sitting 119/82     SaO2: 98 RA  Pt moving well. Able to walk without RW, no rests, increased distance. Many PACs on telemetry. Return to recliner. Encouraged to walk with wife or staff more today. [phone]   Elissa Lovett Rexford CES, ACSM 09/19/2013 1:39 PM

## 2013-09-20 LAB — PROTIME-INR
INR: 2.31 — ABNORMAL HIGH (ref 0.00–1.49)
Prothrombin Time: 25.4 seconds — ABNORMAL HIGH (ref 11.6–15.2)

## 2013-09-20 LAB — GLUCOSE, CAPILLARY: Glucose-Capillary: 90 mg/dL (ref 70–99)

## 2013-09-20 MED ORDER — AMIODARONE HCL 400 MG PO TABS: 400.0000 mg | ORAL_TABLET | Freq: Two times a day (BID) | ORAL | Status: DC

## 2013-09-20 MED ORDER — METOCLOPRAMIDE HCL 5 MG PO TABS
5.0000 mg | ORAL_TABLET | Freq: Four times a day (QID) | ORAL | Status: DC | PRN
  Filled 2013-09-20: qty 1

## 2013-09-20 MED ORDER — POTASSIUM CHLORIDE CRYS ER 20 MEQ PO TBCR
20.0000 meq | EXTENDED_RELEASE_TABLET | Freq: Every day | ORAL | Status: DC
  Administered 2013-09-20: 20 meq via ORAL

## 2013-09-20 MED ORDER — WARFARIN SODIUM 2.5 MG PO TABS: 2.5000 mg | ORAL_TABLET | Freq: Every day | ORAL | Status: DC

## 2013-09-20 MED ORDER — FUROSEMIDE 40 MG PO TABS: 40.0000 mg | ORAL_TABLET | Freq: Every day | ORAL | Status: DC

## 2013-09-20 MED ORDER — TRAMADOL HCL 50 MG PO TABS: 50.0000 mg | ORAL_TABLET | Freq: Four times a day (QID) | ORAL | Status: DC | PRN

## 2013-09-20 MED ORDER — POTASSIUM CHLORIDE CRYS ER 20 MEQ PO TBCR: 20.0000 meq | EXTENDED_RELEASE_TABLET | Freq: Every day | ORAL | Status: DC

## 2013-09-20 MED ORDER — METOPROLOL TARTRATE 25 MG PO TABS
25.0000 mg | ORAL_TABLET | Freq: Two times a day (BID) | ORAL | Status: DC
Start: 2013-09-20 — End: 2014-03-31

## 2013-09-20 MED ORDER — INFLUENZA VAC SPLIT QUAD 0.5 ML IM SUSY: 0.5000 mL | PREFILLED_SYRINGE | INTRAMUSCULAR | Status: DC

## 2013-09-20 MED ORDER — INFLUENZA VAC SPLIT QUAD 0.5 ML IM SUSY
0.5000 mL | PREFILLED_SYRINGE | INTRAMUSCULAR | Status: AC
  Administered 2013-09-20: 0.5 mL via INTRAMUSCULAR
  Filled 2013-09-20: qty 0.5

## 2013-09-20 NOTE — Progress Notes (Signed)
Patient: Rodell Perna. / Admit Date: 09/11/2013 / Date of Encounter: 09/20/2013, 9:09 AM  Subjective: NO CP or SOB, continues to improve walking with PT, feels well. No palpitations. .   Objective: Telemetry: In NSR, frequent PACs, no a-fib.  Physical Exam: Blood pressure 99/60, pulse 79, temperature 98.6 F (37 C), temperature source Oral, resp. rate 16, height  (1.905 m), weight 264 lb 1.6 oz (119.795 kg), SpO2 96.00%. General: Well developed, well nourished, in no acute distress. Head: Normocephalic, atraumatic, sclera non-icteric, no xanthomas, nares are without discharge. Neck: JVP not elevated. Lungs: Clear bilaterally to auscultation without wheezes, rales, or rhonchi. Breathing is unlabored. Heart: RRR S1 S2 without murmurs, rubs, or gallops. Sternal wound without dehiscence Abdomen: Soft, non-tender, non-distended with normoactive bowel sounds. No rebound/guarding. Extremities: No clubbing or cyanosis. Mild bilateral LE edema.  Neuro: Alert and oriented X 3. Moves all extremities spontaneously. Psych:  Responds to questions appropriately with a normal affect.  Intake/Output Summary (Last 24 hours) at 09/20/13 0909 Last data filed at 09/20/13 0818  Gross per 24 hour  Intake    240 ml  Output   1175 ml  Net   -935 ml   Inpatient Medications:   . allopurinol  300 mg Oral Daily  . amiodarone  400 mg Oral BID  . antiseptic oral rinse  7 mL Mouth Rinse q12n4p  . aspirin EC  81 mg Oral Daily  . atorvastatin  40 mg Oral q1800  . bisacodyl  10 mg Oral Daily   Or  . bisacodyl  10 mg Rectal Daily  . Canagliflozin  50 mg Oral BID WC   And  . metFORMIN  1,000 mg Oral BID WC  . docusate sodium  200 mg Oral Daily  . furosemide  40 mg Oral Daily  . Influenza vac split quadrivalent PF  0.5 mL Intramuscular Tomorrow-1000  . insulin aspart  0-20 Units Subcutaneous TID WC  . insulin aspart  0-5 Units Subcutaneous QHS  . metoprolol tartrate  25 mg Oral BID  . pantoprazole   40 mg Oral Daily  . potassium chloride  20 mEq Oral Daily  . pyridoxine  200 mg Oral Daily  . sodium chloride  3 mL Intravenous Q12H  . sodium chloride  3 mL Intravenous Q12H  . warfarin  2.5 mg Oral q1800  . Warfarin - Physician Dosing Inpatient   Does not apply q1800   Infusions:    Labs:  Recent Labs  09/18/13 0500  NA 139  K 3.9  CL 97  CO2 28  GLUCOSE 119*  BUN 25*  CREATININE 1.10  CALCIUM 9.0    Recent Labs  09/18/13 0500  WBC 10.2  HGB 11.8*  HCT 35.1*  MCV 87.3  PLT 271   Radiology/Studies:   Dg Chest 2 View  09/16/2013   CLINICAL DATA:  Post CABG.  EXAM: CHEST  2 VIEW  COMPARISON:  09/15/2013.  FINDINGS: Post CABG.  Cardiomegaly.  Right central line removed.  Interval slight improved aeration left lung base with subsegmental atelectatic changes remaining.  Calcified slightly tortuous aorta.  No gross pneumothorax or pulmonary edema.  Gas distended bowel projects left upper abdomen. It is possible this represents an ileus.  IMPRESSION: Interval improved aeration left lung base with atelectatic changes remaining.  Right central line removed.  Gas distended bowel below left hemidiaphragm.   Electronically Signed   By: Bridgett Larsson M.D.   On: 09/16/2013 08:57   Telemetry: SR, 70-80  BPM    Assessment and Plan   1. 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 Post-op AF 3. Acute diastolic CHF post-operatively 4. HTN with soft BP's recently 5. Obesity Body mass index is 34.32 kg/(m^2). 6. HTN 7. DM  Now on oral Lasix as of yesterday, continues to diurese well, negative 0.8 L since yesterday. Wt 264; was 269 on admission.  With minimal residual edema, I would continue Lasix 40 mg po daily on discharge until the next follow up in our clinic. Continue PO amiodarone 400 mg BID for another week, then 400 mg po daily for a week, followed by 200 mg po daily long term. Plan to follow in our clinic  within the next 2 weeks.  Continue metoprolol to 25 mg po BID. INR 2.3.  From our standpoint the patient can be discharged today.   Lars Masson 09/20/2013

## 2013-09-20 NOTE — Progress Notes (Addendum)
      [address]       [place] [address]             [phone]        8 Days Post-Op Procedure(s) (LRB): CORONARY ARTERY BYPASS GRAFTING (CABG), on pump, times five, using left internal mammary artery, right greater saphenous vein hervested endoscopically. (N/A) INTRAOPERATIVE TRANSESOPHAGEAL ECHOCARDIOGRAM (N/A)  Subjective: Patient's only complaint this am is a sore throat. He had a bowel movement last night.  Objective: Vital signs in last 24 hours: Temp:  [98 F (36.7 C)-99 F (37.2 C)] 98.6 F (37 C) (09/11 0509) Pulse Rate:  [78-85] 79 (09/11 0509) Cardiac Rhythm:  [-] Normal sinus rhythm (09/10 2057) Resp:  [16-18] 16 (09/11 0509) BP: (99-120)/(60-80) 99/60 mmHg (09/11 0509) SpO2:  [94 %-96 %] 96 % (09/11 0509) Weight:  [264 lb 1.6 oz (119.795 kg)] 264 lb 1.6 oz (119.795 kg) (09/11 0509)  Pre op weight 121 kg Current Weight  09/20/13 264 lb 1.6 oz (119.795 kg)      Intake/Output from previous day: 09/10 0701 - 09/11 0700 In: 120 [P.O.:120] Out: 1175 [Urine:1175]   Physical Exam:  Cardiovascular: RRR, no murmurs, gallops, or rubs. Pulmonary: Clear to auscultation bilaterally; no rales, wheezes, or rhonchi. Abdomen: Soft, non tender, bowel sounds present. Extremities: Mild lower extremity edema R>L Wounds: Clean and dry.  No erythema or signs of infection.  Lab Results: CBC:  Recent Labs  09/18/13 0500  WBC 10.2  HGB 11.8*  HCT 35.1*  PLT 271   BMET:   Recent Labs  09/18/13 0500  NA 139  K 3.9  CL 97  CO2 28  GLUCOSE 119*  BUN 25*  CREATININE 1.10  CALCIUM 9.0    PT/INR:  Lab Results  Component Value Date   INR 2.31* 09/20/2013   INR 2.01* 09/19/2013   INR 1.47 09/18/2013   ABG:  INR: Will add last result for INR, ABG once components are confirmed Will add last 4 CBG results once components are confirmed  Assessment/Plan:  1. CV - PAF.Had PACs yesterday and brief a fib. Mostly in SR.  On Amiodarone 400  bid, Lopressor 25 bid, and Coumadin. INR slightly increased from 2.01 to 2.31. Continue with low dose Coumadin. 2.  Pulmonary - Encourage incentive spirometer 3. Volume Overload - On Lasix 40 daily. Although below pre op weight, still with some LE edema. Will continue Lasix for a few days at discharge 4.  Acute blood loss anemia - H and H stable at 11.8 and 35.1 5. DM-CBGs 166/95/90. On Metformin 1000 bid. Pre op HGA1C 6.5.  6. Regarding sore throat, no signs of thrush. May take lozenges or chloraseptic PRN. 7. Likely discharge later this am  ZIMMERMAN,DONIELLE MPA-C 09/20/2013,7:33 AM      I have seen and examined the patient and agree with the assessment and plan as outlined.  Ready for d/c home.   Amontae Ng H 09/20/2013 9:14 AM

## 2013-09-20 NOTE — Progress Notes (Signed)
Patient discharge instructions and discharge medications reviewed with patient and wife. Both verbalize understanding. Patient also given appointment times along with prescriptions for new medications. Patient received flu vaccine 9/11 prior to discharge. Bradley Ferris RN BSN 09/20/2013 10:51 AM

## 2013-09-20 NOTE — Progress Notes (Signed)
[id number] Education completed [id number] wife who voiced understanding. Encouraged IS and flutter valve. Referring to [facility] CRP 2. Gave diabetic and heart healthy diets. Luetta Nutting RN BSN 09/20/2013 11:05 AM

## 2013-09-23 ENCOUNTER — Ambulatory Visit (INDEPENDENT_AMBULATORY_CARE_PROVIDER_SITE_OTHER): Payer: BC Managed Care – PPO | Admitting: Pharmacist Clinician (PhC)/ Clinical Pharmacy Specialist

## 2013-09-23 ENCOUNTER — Encounter: Payer: Self-pay | Admitting: Cardiology

## 2013-09-23 ENCOUNTER — Ambulatory Visit (INDEPENDENT_AMBULATORY_CARE_PROVIDER_SITE_OTHER): Payer: Self-pay | Admitting: Physician Assistant

## 2013-09-23 ENCOUNTER — Ambulatory Visit (INDEPENDENT_AMBULATORY_CARE_PROVIDER_SITE_OTHER): Payer: BC Managed Care – PPO | Admitting: Cardiology

## 2013-09-23 VITALS — BP 113/84 | HR 98 | Ht 75.0 in | Wt 245.0 lb

## 2013-09-23 VITALS — BP 114/78 | HR 92 | Ht 75.0 in | Wt 245.0 lb

## 2013-09-23 DIAGNOSIS — L03119 Cellulitis of unspecified part of limb: Secondary | ICD-10-CM

## 2013-09-23 DIAGNOSIS — I209 Angina pectoris, unspecified: Secondary | ICD-10-CM

## 2013-09-23 DIAGNOSIS — I480 Paroxysmal atrial fibrillation: Secondary | ICD-10-CM

## 2013-09-23 DIAGNOSIS — I4891 Unspecified atrial fibrillation: Secondary | ICD-10-CM

## 2013-09-23 DIAGNOSIS — T07XXXA Unspecified multiple injuries, initial encounter: Secondary | ICD-10-CM

## 2013-09-23 DIAGNOSIS — Z7901 Long term (current) use of anticoagulants: Secondary | ICD-10-CM | POA: Insufficient documentation

## 2013-09-23 DIAGNOSIS — L089 Local infection of the skin and subcutaneous tissue, unspecified: Secondary | ICD-10-CM

## 2013-09-23 DIAGNOSIS — I25119 Atherosclerotic heart disease of native coronary artery with unspecified angina pectoris: Secondary | ICD-10-CM

## 2013-09-23 DIAGNOSIS — T148XXA Other injury of unspecified body region, initial encounter: Principal | ICD-10-CM

## 2013-09-23 DIAGNOSIS — Z951 Presence of aortocoronary bypass graft: Secondary | ICD-10-CM

## 2013-09-23 DIAGNOSIS — L02419 Cutaneous abscess of limb, unspecified: Secondary | ICD-10-CM

## 2013-09-23 DIAGNOSIS — I2510 Atherosclerotic heart disease of native coronary artery without angina pectoris: Secondary | ICD-10-CM

## 2013-09-23 DIAGNOSIS — T814XXA Infection following a procedure, initial encounter: Principal | ICD-10-CM

## 2013-09-23 DIAGNOSIS — IMO0001 Reserved for inherently not codable concepts without codable children: Secondary | ICD-10-CM

## 2013-09-23 LAB — POCT INR: INR: 3.3

## 2013-09-23 MED ORDER — ONDANSETRON HCL 4 MG PO TABS: 4.0000 mg | ORAL_TABLET | Freq: Three times a day (TID) | ORAL | Status: DC | PRN

## 2013-09-23 MED ORDER — CEPHALEXIN 500 MG PO CAPS: 500.0000 mg | ORAL_CAPSULE | Freq: Three times a day (TID) | ORAL | Status: DC

## 2013-09-23 NOTE — Patient Instructions (Addendum)
PLEASE GO TO DR Orvan July OFFICE NOW- FOR EVALUATION OF RIGHT LEG

## 2013-09-23 NOTE — Progress Notes (Signed)
PCP: Jolene Provost, MD  Clinic Note: Chief Complaint  Patient presents with  . Follow-up    Add-on pt had OHS last thursday, pt c/o swelling and pain in right leg, can't bend his knee    HPI: Frank Moreno. is a 63 y.o. male patient of Dr. Rennis Golden who is status post recent CABG performed for multivessel disease in the setting of unstable angina.  He was seen by Phillips Hay, RPH-CPP for evaluation INR due to his being placed on warfarin therapy for postoperative A. fib.  I was asked to see him today as a work-in patient after he was noted to have significant swelling at his SVG extraction site status post CABG last week.   Past Medical History  Diagnosis Date  . Obesity (BMI 30.0-34.9) 02/25/2013  . Atherosclerotic heart disease of native coronary artery with unstable angina pectoris 02/25/2013    3 Vs CAD (despite Neg Myoview) Cath 08/2013: pLAD Ca2++ 90%,mCx (after OM1) 95-99%, dRCA-ostRPDA 95% --> referred for cabg  . H/O unstable angina 08/2013    See Cath Above --CABG  . 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 Essential hypertension 02/25/2013  . Dyslipidemia, goal LDL below 70 08/30/2013    3V CAD  . Diabetes mellitus type 2 with complications     3V CAD  . Ascending aorta dilatation 06/27/2013    Max reported diameter 4.6 cm by CTA   . Postoperative atrial fibrillation 09/13/2013  . Arthritis    Interval History: CABG course. Generally by postoperative A. fib for which he was started on amiodarone. He has noted some nausea since discharge, but said that yesterday he had his best day able to do his walking 3 times a day. He did quite well with that and not have been having any problems. However today when he woke up he noted significant pain in his right lower leg at the graft extraction site. He noted it was extremely painful and swollen with a knot appearing between the 2  extraction sites. It was red and tender to touch. Moving his foot became extremely painful. He is still very weak and fatigued today with maybe some chills but no fever. He just generally felt as though he was quite sick with some nausea that is worsening. He has not had any chest tightness or pressure with rest or exertion since his CABG. He has not had any heart-type symptoms of PND, orthopnea or edema. Normal as he fell any significant rapid or irregular heartbeats.  ROS: A comprehensive Review of Systems - was performed Review of Systems  Constitutional: Positive for chills, weight loss and malaise/fatigue. Negative for fever.       Post-OP Wgt Loss.  HENT: Negative for congestion and nosebleeds.   Eyes: Negative.  Negative for double vision.  Respiratory: Negative for cough, hemoptysis, sputum production, shortness of breath and wheezing.   Cardiovascular: Positive for leg swelling. Negative for chest pain, palpitations, orthopnea, claudication and PND.       Localized swelling & erythema between R Leg SVG extraction sites, - very tender, unable to move foot without pain.  Gastrointestinal: Positive for nausea. Negative for vomiting, abdominal pain, blood in stool and melena.  Genitourinary: Negative for hematuria and flank pain.  Musculoskeletal: Positive for back pain, myalgias and neck pain.       Leg pain -  noted above   Neurological: Positive for dizziness and weakness. Negative for sensory change, speech change, focal weakness, seizures and loss of consciousness.  Psychiatric/Behavioral: Positive for depression. The patient is not nervous/anxious.    MEDICATIONS AND ALLERGIES REVIEWED IN EPIC -- NO change SOCIAL AND FAMILY HISTORY REVIEWED IN EPIC -- NO change PSH REVIEWED IN EPIC - UPDATED WITH CATH RESULTS NOTED ABOVE.  Wt Readings from Last 3 Encounters:  09/23/13 245 lb (111.131 kg)  09/23/13 245 lb (111.131 kg)  09/20/13 264 lb 1.6 oz (119.795 kg)   20 pound weight loss  postop  PHYSICAL EXAM BP 113/84  Pulse 98  Ht  (1.905 m)  Wt 245 lb (111.131 kg)  BMI 30.62 kg/m2 Physical Exam  Constitutional: He is oriented to person, place, and time. He appears well-developed and well-nourished.  Overall ill-appearing, pale  HENT:  Head: Normocephalic and atraumatic.  Nose: Nose normal.  Mouth/Throat: Oropharynx is clear and moist. No oropharyngeal exudate.  Eyes: Conjunctivae and EOM are normal. Pupils are equal, round, and reactive to light. No scleral icterus.  Neck: Normal range of motion. Neck supple. No JVD present.  Cardiovascular: Normal rate, regular rhythm, normal heart sounds and intact distal pulses.  Exam reveals no gallop and no friction rub.   No murmur heard. Pulmonary/Chest: Effort normal and breath sounds normal.  Abdominal: Soft.  Musculoskeletal: Normal range of motion.  Neurological: He is alert and oriented to person, place, and time.  Skin: Ecchymosis noted. No rash noted. He is diaphoretic. There is erythema. No cyanosis. No pallor. Nails show no clubbing.     Psychiatric: He has a normal mood and affect. Judgment and thought content normal.   ASSESSMENT / PLAN: Infection of wound hematoma - right leg SVG extraction site ([facility]) I'm quite concerned that the erythema and fluctuant nature of the hematoma is concerning for possible is underlying infection despite there not being any warmth to touch. My initial plan was to check a CBC and initiate antibiotic coverage. I was however able to get in contact with the [facility] outpatient clinic. They have availability to work him in this afternoon to be seen by one of the PAs and probably even Dr. Cornelius Moras.   Based on the fact this is postoperative manifestation, I felt it best to be evaluated by surgeon just to make sure that no exploration would be warranted. Of note, he mentions that he is allergic to Ceclor and penicillins.  Atherosclerotic heart disease of native  coronary artery with angina pectoris No active angina postop  Atrial fibrillation, post-operative Discharge on amiodarone with no apparent recurrence. Question if amiodarone could be causing nausea     Followup:  as recommended roughly 2 weeks with Dr. Rennis Golden or PA/NP   Marykay Lex, M.D., M.S. Interventional Cardiologist   Pager # [phone]

## 2013-09-23 NOTE — Progress Notes (Signed)
[address]       [place] [address]             [phone]          HPI: The patient presents to the office today for evaluation of his right leg wound. He is status post CABG x 5 by Dr. Cornelius Moras on 09/12/2013.  His postoperative course was notable for atrial fibrillation, which required multiple medications to convert to sinus rhythm.  He was ultimately discharged home on 9/11 in stable condition.    He had been doing well at home until he awoke this morning with significant pain in his right lower leg EVH tunnel site.  The area has become increasingly red and tender to touch.  He denies fevers, chills or drainage. He has also had persistent nausea and anorexia since he was in the hospital.  He was seen by Dr. Rennis Golden earlier today and it was recommended that he be seen in our office.     Current Outpatient Prescriptions  Medication Sig Dispense Refill  . albuterol (PROVENTIL HFA;VENTOLIN HFA) 108 (90 BASE) MCG/ACT inhaler Inhale 1-2 puffs into the lungs every 6 (six) hours as needed for wheezing or shortness of breath.      . allopurinol (ZYLOPRIM) 300 MG tablet Take 300 mg by mouth daily.      Marland Kitchen amiodarone (PACERONE) 400 MG tablet Take 1 tablet (400 mg total) by mouth 2 (two) times daily. For 5 days; then take Amiodarone 200 mg by mouth two times daily for one week. Finally, take Amiodarone 200 mg by mouth daily thereafter  60 tablet  1  . aspirin 81 MG tablet Take 81 mg by mouth daily.      . Canagliflozin-Metformin HCl (INVOKAMET) 50-1000 MG TABS Take 1 tablet by mouth 2 (two) times daily.      . Cholecalciferol (VITAMIN D-3) 1000 UNITS CAPS Take 4,000 Units by mouth daily.      . furosemide (LASIX) 40 MG tablet Take 1 tablet (40 mg total) by mouth daily.  14 tablet  0  . metoprolol tartrate (LOPRESSOR) 25 MG tablet Take 1 tablet (25 mg total) by mouth 2 (two) times daily.  60 tablet  1  . potassium chloride SA (K-DUR,KLOR-CON) 20 MEQ tablet Take 1 tablet (20 mEq  total) by mouth daily.  14 tablet  0  . pyridoxine (B-6) 100 MG tablet Take 200 mg by mouth daily.      . simvastatin (ZOCOR) 80 MG tablet Take 80 mg by mouth daily.      Marland Kitchen testosterone (ANDROGEL) 50 MG/5GM GEL Place 10 g onto the skin daily.      Marland Kitchen tiZANidine (ZANAFLEX) 4 MG tablet Take 4 mg by mouth 2 (two) times daily as needed for muscle spasms.       . traMADol (ULTRAM) 50 MG tablet Take 1 tablet (50 mg total) by mouth every 6 (six) hours as needed for moderate pain.  30 tablet  0  . vitamin C (ASCORBIC ACID) 250 MG tablet Take 500 mg by mouth daily.      Marland Kitchen warfarin (COUMADIN) 2.5 MG tablet Take 1 tablet (2.5 mg total) by mouth daily at 6 PM. Or as directed.  30 tablet  1  . cephALEXin (KEFLEX) 500 MG capsule Take 1 capsule (500 mg total) by mouth 3 (three) times daily. X 1 week  21 capsule  0  . ondansetron (ZOFRAN) 4 MG tablet Take 1 tablet (  4 mg total) by mouth every 8 (eight) hours as needed for nausea or vomiting.  20 tablet  0  . sildenafil (VIAGRA) 50 MG tablet Take 50 mg by mouth as needed for erectile dysfunction.        No current facility-administered medications for this visit.     Physical Exam: BP 114/78 HR 92 Wounds: Sternal wound healing well.  R leg EVH incision intact and dry.  There is ecchymosis of the right thigh, with mild erythema and ecchymosis of the right lower leg tunnel area .  There is marked tenderness to palpation in this area. No drainage.  Heart: regular rate and rhythm Lungs: Clear to auscultation Extremities: Mild RLE edema   Diagnostic Tests: Chest xray: No results found.     Assessment/Plan: The patient has a hematoma along the [facility] tract which appears to have led to an early infection in the lower leg.  He also has had persistent nausea since his hospitalization which may be related to Amiodarone.  Dr. Cornelius Moras also saw the patient at this visit, and recommended that we start him on antibiotics for his leg infection.  We have discussed his history  of penicillin allergy, and it is felt that despite this, Keflex is the best choice of antibiotics for this condition in light of the fact that he is also on Amiodarone. He is given a prescription of Keflex 500 mg tid x 1 week, and asked to call if he has any problems with it.  We also decreased his Amiodarone to 200 mg bid since he is maintaining sinus rhythm.  I gave him a prescription for Zofran prn nausea.  We will see him back for a wound recheck in 1 week, and he is asked to call in the interim if he develops worsening pain, redness, swelling, tenderness or fever.

## 2013-09-24 NOTE — Assessment & Plan Note (Signed)
I'm quite concerned that the erythema and fluctuant nature of the hematoma is concerning for possible is underlying infection despite there not being any warmth to touch. My initial plan was to check a CBC and initiate antibiotic coverage. I was however able to get in contact with the [facility] outpatient clinic. They have availability to work him in this afternoon to be seen by one of the PAs and probably even Dr. Cornelius Moras.   Based on the fact this is postoperative manifestation, I felt it best to be evaluated by surgeon just to make sure that no exploration would be warranted. Of note, he mentions that he is allergic to Ceclor and penicillins.

## 2013-09-24 NOTE — Assessment & Plan Note (Signed)
No active angina postop

## 2013-09-24 NOTE — Assessment & Plan Note (Signed)
Discharge on amiodarone with no apparent recurrence. Question if amiodarone could be causing nausea

## 2013-09-27 ENCOUNTER — Ambulatory Visit (INDEPENDENT_AMBULATORY_CARE_PROVIDER_SITE_OTHER): Payer: Self-pay | Admitting: Physician Assistant

## 2013-09-27 VITALS — BP 113/80 | HR 77 | Ht 75.0 in | Wt 245.0 lb

## 2013-09-27 DIAGNOSIS — T814XXA Infection following a procedure, initial encounter: Principal | ICD-10-CM

## 2013-09-27 DIAGNOSIS — L03119 Cellulitis of unspecified part of limb: Secondary | ICD-10-CM

## 2013-09-27 DIAGNOSIS — L02419 Cutaneous abscess of limb, unspecified: Secondary | ICD-10-CM

## 2013-09-27 DIAGNOSIS — IMO0001 Reserved for inherently not codable concepts without codable children: Secondary | ICD-10-CM

## 2013-09-27 MED ORDER — CEPHALEXIN 500 MG PO CAPS: 500.0000 mg | ORAL_CAPSULE | Freq: Three times a day (TID) | ORAL | Status: DC

## 2013-09-27 NOTE — Progress Notes (Signed)
[address]       [place] [address]             [phone]          HPI: Mr. Bartmess comes in today for a wound recheck. He is status post CABG x 5 by Dr. Cornelius Moras on 09/12/2013 with right leg EVH. He was seen on Monday with new erythema, swelling and tenderness of his right lower leg at the [facility] tunnel.  He was placed on po Keflex and was scheduled to return in 1 week for recheck.  He called the office today requesting to be seen today.  He reports that his leg is no better than when he was seen earlier in the week, although it seems to be no worse.  He has pain when leaving it in a dependent position and the leg seems to be more stiff when getting out of bed in the mornings. He has had no fever or drainage from the incision.  Although he has a reported allergy to penicillin (he thinks it caused shortness of breath and swelling), he is tolerating the Keflex without problem.  Otherwise, he is stable.  Nausea is improved with the decreased dose of Amiodarone and he is having no palpitations, chest discomfort or dyspnea.    Current Outpatient Prescriptions  Medication Sig Dispense Refill  . albuterol (PROVENTIL HFA;VENTOLIN HFA) 108 (90 BASE) MCG/ACT inhaler Inhale 1-2 puffs into the lungs every 6 (six) hours as needed for wheezing or shortness of breath.      . allopurinol (ZYLOPRIM) 300 MG tablet Take 300 mg by mouth daily.      Marland Kitchen amiodarone (PACERONE) 400 MG tablet Take 1 tablet (400 mg total) by mouth 2 (two) times daily. For 5 days; then take Amiodarone 200 mg by mouth two times daily for one week. Finally, take Amiodarone 200 mg by mouth daily thereafter  60 tablet  1  . aspirin 81 MG tablet Take 81 mg by mouth daily.      . Canagliflozin-Metformin HCl (INVOKAMET) 50-1000 MG TABS Take 1 tablet by mouth 2 (two) times daily.      . cephALEXin (KEFLEX) 500 MG capsule Take 1 capsule (500 mg total) by mouth 3 (three) times daily. X 1 week  21 capsule  0  . cephALEXin  (KEFLEX) 500 MG capsule Take 1 capsule (500 mg total) by mouth 3 (three) times daily. X 1 week  21 capsule  0  . Cholecalciferol (VITAMIN D-3) 1000 UNITS CAPS Take 4,000 Units by mouth daily.      . furosemide (LASIX) 40 MG tablet Take 1 tablet (40 mg total) by mouth daily.  14 tablet  0  . metoprolol tartrate (LOPRESSOR) 25 MG tablet Take 1 tablet (25 mg total) by mouth 2 (two) times daily.  60 tablet  1  . potassium chloride SA (K-DUR,KLOR-CON) 20 MEQ tablet Take 1 tablet (20 mEq total) by mouth daily.  14 tablet  0  . pyridoxine (B-6) 100 MG tablet Take 200 mg by mouth daily.      . simvastatin (ZOCOR) 80 MG tablet Take 80 mg by mouth daily.      Marland Kitchen testosterone (ANDROGEL) 50 MG/5GM GEL Place 10 g onto the skin daily.      Marland Kitchen tiZANidine (ZANAFLEX) 4 MG tablet Take 4 mg by mouth 2 (two) times daily as needed for muscle spasms.       . vitamin C (ASCORBIC ACID) 250  MG tablet Take 500 mg by mouth daily.      Marland Kitchen warfarin (COUMADIN) 2.5 MG tablet Take 1 tablet (2.5 mg total) by mouth daily at 6 PM. Or as directed.  30 tablet  1  . ondansetron (ZOFRAN) 4 MG tablet Take 1 tablet (4 mg total) by mouth every 8 (eight) hours as needed for nausea or vomiting.  20 tablet  0  . sildenafil (VIAGRA) 50 MG tablet Take 50 mg by mouth as needed for erectile dysfunction.       . traMADol (ULTRAM) 50 MG tablet Take 1 tablet (50 mg total) by mouth every 6 (six) hours as needed for moderate pain.  30 tablet  0   No current facility-administered medications for this visit.     Physical Exam: BP 113/80 HR 77 Sats 94% RA Wounds: The right lower leg EVH harvest tunnel remains mildly erythematous with resolving ecchymosis.  There is still tenderness to palpation of the surrounding area, although it is somewhat less than Monday.  Incisions are intact and dry.     Diagnostic Tests: Chest xray: No results found.     Assessment/Plan: Infected hematoma, right lower leg EVH tunnel- His leg appears to be at least  stable since Monday and possibly a bit better.  Dr. Cornelius Moras also saw the patient and discussed at length continued home care vs admission for IV antibiotics.  The patient felt strongly that he did not want to be admitted, and Dr. Cornelius Moras felt that since he was not worse, we could safely continue oral Keflex for an additional week, then re-evaluate.  The patient was encouraged to elevate the leg when not walking, and to call if he has any worsening of his symptoms.  If he does not improve or worsens, we will need to admit him for IV antibiotics.  We will see him back on 9/28 for a recheck.

## 2013-09-30 ENCOUNTER — Ambulatory Visit (INDEPENDENT_AMBULATORY_CARE_PROVIDER_SITE_OTHER): Payer: BC Managed Care – PPO | Admitting: Pharmacist Clinician (PhC)/ Clinical Pharmacy Specialist

## 2013-09-30 ENCOUNTER — Ambulatory Visit: Payer: BC Managed Care – PPO

## 2013-09-30 DIAGNOSIS — Z7901 Long term (current) use of anticoagulants: Secondary | ICD-10-CM

## 2013-09-30 DIAGNOSIS — I4891 Unspecified atrial fibrillation: Secondary | ICD-10-CM

## 2013-09-30 DIAGNOSIS — I480 Paroxysmal atrial fibrillation: Secondary | ICD-10-CM

## 2013-09-30 LAB — POCT INR: INR: 1.2

## 2013-10-01 ENCOUNTER — Other Ambulatory Visit: Payer: Self-pay | Admitting: *Deleted

## 2013-10-01 ENCOUNTER — Telehealth: Payer: Self-pay | Admitting: Internal Medicine

## 2013-10-01 DIAGNOSIS — R110 Nausea: Secondary | ICD-10-CM

## 2013-10-01 DIAGNOSIS — IMO0001 Reserved for inherently not codable concepts without codable children: Secondary | ICD-10-CM

## 2013-10-01 DIAGNOSIS — T814XXA Infection following a procedure, initial encounter: Secondary | ICD-10-CM

## 2013-10-01 MED ORDER — ONDANSETRON HCL 4 MG PO TABS: 4.0000 mg | ORAL_TABLET | Freq: Three times a day (TID) | ORAL | Status: DC | PRN

## 2013-10-01 NOTE — Telephone Encounter (Signed)
If he can keep down oral miralax, I would advise that (17 g packet)- otherwise, he may need to try a retention enema at home. They can be purchased at the drug store.   Dr. Rennis Golden

## 2013-10-01 NOTE — Telephone Encounter (Signed)
Informed patient's wife of advise per Dr. Rennis Golden. She voiced understanding. Advised that she contact [facility] office for zofran refill

## 2013-10-01 NOTE — Telephone Encounter (Signed)
Spoke with patient's wife. He complains of severe constipation, has only had 1 BM since discharge (hard). She reports she is trying to keep him hydrated, but he is miserable. He has tried castor oil and OTC colace for constipation with no relief. He is also throwing up - requested zofran refill.   Patient has not mentioned this in great detail to providers at hospital follow up visit, as his pressing concern was his wound/leg.   Will defer to Dr. Rennis Golden to advise.

## 2013-10-01 NOTE — Telephone Encounter (Signed)
Please call,pt just had heart surgery and now he is constipated. She needs your advice.

## 2013-10-03 ENCOUNTER — Telehealth: Payer: Self-pay | Admitting: *Deleted

## 2013-10-03 NOTE — Telephone Encounter (Signed)
Faxed signed order for [facility] - CPT [id number] 3-4x/week 2-4 METS 6minutesx2

## 2013-10-07 ENCOUNTER — Encounter: Payer: Self-pay | Admitting: Thoracic Surgery (Cardiothoracic Vascular Surgery)

## 2013-10-07 ENCOUNTER — Ambulatory Visit (INDEPENDENT_AMBULATORY_CARE_PROVIDER_SITE_OTHER): Payer: Self-pay | Admitting: Thoracic Surgery (Cardiothoracic Vascular Surgery)

## 2013-10-07 ENCOUNTER — Ambulatory Visit (INDEPENDENT_AMBULATORY_CARE_PROVIDER_SITE_OTHER): Payer: BC Managed Care – PPO | Admitting: Pharmacist Clinician (PhC)/ Clinical Pharmacy Specialist

## 2013-10-07 ENCOUNTER — Encounter: Payer: BC Managed Care – PPO | Admitting: Physician Assistant

## 2013-10-07 ENCOUNTER — Other Ambulatory Visit: Payer: Self-pay | Admitting: *Deleted

## 2013-10-07 VITALS — BP 111/87 | HR 80 | Ht 75.0 in | Wt 245.0 lb

## 2013-10-07 DIAGNOSIS — I480 Paroxysmal atrial fibrillation: Secondary | ICD-10-CM

## 2013-10-07 DIAGNOSIS — Z951 Presence of aortocoronary bypass graft: Secondary | ICD-10-CM

## 2013-10-07 DIAGNOSIS — I4891 Unspecified atrial fibrillation: Secondary | ICD-10-CM

## 2013-10-07 DIAGNOSIS — Z7901 Long term (current) use of anticoagulants: Secondary | ICD-10-CM

## 2013-10-07 LAB — POCT INR: INR: 1.2

## 2013-10-07 MED ORDER — WARFARIN SODIUM 2.5 MG PO TABS: ORAL_TABLET | ORAL | Status: DC

## 2013-10-07 MED ORDER — AMIODARONE HCL 400 MG PO TABS: 200.0000 mg | ORAL_TABLET | Freq: Every day | ORAL | Status: DC

## 2013-10-07 NOTE — Progress Notes (Signed)
[address]       [place] [address]             [phone]     CARDIOTHORACIC SURGERY OFFICE NOTE  Referring Provider is Chrystie Nose., MD PCP is Jolene Provost, MD   HPI:  Patient returns for wound check related to cellulitis involving the right lower leg endoscopic vein harvest incision. He originally underwent coronary artery bypass grafting x5 on 09/12/2013. His early postoperative recovery was notable for recurrent paroxysmal atrial fibrillation for which he was treated with amiodarone and anticoagulated using Coumadin.  He was seen in the office 2 weeks ago at which time he had developed cellulitis involving his right lower leg endoscopic vein harvest tunnel. He was treated with oral Keflex and seen more recently on 09/27/2013. Since then he has done well. He states that he feels better and he no longer has significant pain or tenderness in the right lower leg. He has not had fevers or chills. The swelling has improved. He otherwise is making good progress. He has minimal residual soreness in his chest and he is no longer taking any sort of oral narcotic pain relievers. He has not had any tachypalpitations, and the automatic blood pressure cuff that he uses has documented regular heart rate with no sign of irregularity to suggest recurrent atrial fibrillation for the past 2 weeks.  He reports a slight dry nonproductive cough without malaise.   Current Outpatient Prescriptions  Medication Sig Dispense Refill  . albuterol (PROVENTIL HFA;VENTOLIN HFA) 108 (90 BASE) MCG/ACT inhaler Inhale 1-2 puffs into the lungs every 6 (six) hours as needed for wheezing or shortness of breath.      . allopurinol (ZYLOPRIM) 300 MG tablet Take 300 mg by mouth daily.      Marland Kitchen amiodarone (PACERONE) 400 MG tablet Take 0.5 tablets (200 mg total) by mouth daily.  60 tablet  1  . aspirin 81 MG tablet Take 81 mg by mouth daily.      . Canagliflozin-Metformin HCl (INVOKAMET) 50-1000 MG  TABS Take 1 tablet by mouth 2 (two) times daily.      . Cholecalciferol (VITAMIN D-3) 1000 UNITS CAPS Take 4,000 Units by mouth daily.      . metoprolol tartrate (LOPRESSOR) 25 MG tablet Take 1 tablet (25 mg total) by mouth 2 (two) times daily.  60 tablet  1  . pyridoxine (B-6) 100 MG tablet Take 200 mg by mouth daily.      Marland Kitchen testosterone (ANDROGEL) 50 MG/5GM GEL Place 10 g onto the skin daily.      Marland Kitchen tiZANidine (ZANAFLEX) 4 MG tablet Take 4 mg by mouth 2 (two) times daily as needed for muscle spasms.       . vitamin C (ASCORBIC ACID) 250 MG tablet Take 500 mg by mouth daily.      Marland Kitchen warfarin (COUMADIN) 2.5 MG tablet Take 1-2 tablets by mouth daily as directed by coumadin clinic  50 tablet  1  . ondansetron (ZOFRAN) 4 MG tablet Take 1 tablet (4 mg total) by mouth every 8 (eight) hours as needed for nausea or vomiting.  20 tablet  0  . sildenafil (VIAGRA) 50 MG tablet Take 50 mg by mouth as needed for erectile dysfunction.       . simvastatin (ZOCOR) 80 MG tablet Take 80 mg by mouth daily.      . traMADol (ULTRAM) 50 MG tablet Take 1 tablet (50 mg total) by mouth  every 6 (six) hours as needed for moderate pain.  30 tablet  0   No current facility-administered medications for this visit.      Physical Exam:   BP 111/87  Pulse 80  Ht  (1.905 m)  Wt 245 lb (111.131 kg)  BMI 30.62 kg/m2  SpO2 97%  General:  Well-appearing  Chest:   clear  CV:   Regular rate and rhythm without murmur  Incisions:  Clean and dry healing nicely  Abdomen:  Soft nontender  Extremities:  Warm and well-perfused, cellulitis involving right lower extremity has resolved  Diagnostic Tests:  n/a   Impression:  The patient is now making good progress just over 3 weeks status post coronary artery bypass grafting x5. Cellulitis involving the right lower leg endoscopic vein harvest incision appears to have resolved. The patient denies any symptoms to suggest recurrent paroxysmal atrial  fibrillation.  Plan:  I've encouraged patient to continue to gradually increase his physical activity as tolerated with his primary limitation remaining that he refrain from heavy lifting or strenuous use of his arms or shoulders for least another 2 months.  I think he may resume driving an automobile. I have renewed his prescription for amiodarone for an additional 4 weeks.  I have encouraged patient to go ahead and get started in the outpatient cardiac rehabilitation program. All of his questions been addressed. He will return in 4 weeks for routine followup or sooner as needed.   Salvatore Decent. Cornelius Moras, MD 10/07/2013 5:05 PM

## 2013-10-07 NOTE — Patient Instructions (Signed)
The patient is encouraged to enroll and participate in the outpatient cardiac rehab program beginning as soon as practical.  The patient may return to driving an automobile as long as they are no longer requiring oral narcotic pain relievers during the daytime.  It would be wise to start driving only short distances during the daylight and gradually increase from there as they feel comfortable.  Do not take simvastatin (Zocor) while you are taking amiodarone

## 2013-10-09 ENCOUNTER — Ambulatory Visit (INDEPENDENT_AMBULATORY_CARE_PROVIDER_SITE_OTHER): Payer: BC Managed Care – PPO | Admitting: Physician Assistant

## 2013-10-09 ENCOUNTER — Encounter: Payer: Self-pay | Admitting: Physician Assistant

## 2013-10-09 ENCOUNTER — Other Ambulatory Visit: Payer: Self-pay | Admitting: Pharmacist Clinician (PhC)/ Clinical Pharmacy Specialist

## 2013-10-09 ENCOUNTER — Ambulatory Visit
Admission: RE | Admit: 2013-10-09 | Discharge: 2013-10-09 | Disposition: A | Discharge status: Home / Self Care | Payer: BC Managed Care – PPO | Source: Ambulatory Visit | Attending: Thoracic Surgery (Cardiothoracic Vascular Surgery) | Admitting: Thoracic Surgery (Cardiothoracic Vascular Surgery)

## 2013-10-09 VITALS — BP 118/70 | HR 70 | Ht 75.0 in | Wt 251.8 lb

## 2013-10-09 DIAGNOSIS — I4891 Unspecified atrial fibrillation: Secondary | ICD-10-CM

## 2013-10-09 DIAGNOSIS — Z951 Presence of aortocoronary bypass graft: Secondary | ICD-10-CM

## 2013-10-09 DIAGNOSIS — T148XXA Other injury of unspecified body region, initial encounter: Secondary | ICD-10-CM

## 2013-10-09 DIAGNOSIS — L089 Local infection of the skin and subcutaneous tissue, unspecified: Secondary | ICD-10-CM

## 2013-10-09 DIAGNOSIS — T07XXXA Unspecified multiple injuries, initial encounter: Secondary | ICD-10-CM

## 2013-10-09 DIAGNOSIS — I10 Essential (primary) hypertension: Secondary | ICD-10-CM

## 2013-10-09 MED ORDER — WARFARIN SODIUM 2.5 MG PO TABS: ORAL_TABLET | ORAL | Status: DC

## 2013-10-09 NOTE — Assessment & Plan Note (Signed)
Infection has resolved. Leg wound looks good.

## 2013-10-09 NOTE — Assessment & Plan Note (Signed)
Patient is doing well status post CABG. He starts cardiac rehabilitation October 13. He is scheduled for his chest x-ray today.

## 2013-10-09 NOTE — Assessment & Plan Note (Signed)
Blood pressure stable ? ?

## 2013-10-09 NOTE — Patient Instructions (Signed)
.  Your physician has recommended you make the following change in your medication:     START TAKING AMIODARONE 200 MG ONCE A DAY   Your physician recommends that you schedule a follow-up appointment in:  WITH DR HILTY IN TO 4 TO 6 WEEKS

## 2013-10-09 NOTE — Progress Notes (Signed)
HPI: This is a very pleasant 63 year old male patient of Dr. Rennis GoldenHilty who has coronary artery disease status post CABG x5 banding of the proximal descending thoracic aorta for mild dilatation of the ascending aorta on 09/12/13. Postop he had atrial fibrillation treated with amiodarone and Coumadin. He also developed cellulitis and hematoma around the right leg SVG extraction site. He was placed on Keflex and followed by Dr. Cornelius Moraswen. He also had acute diastolic heart failure postop treated with Lasix. His amiodarone was to be reduced to 200 mg daily but they have not picked up the new prescription yet.  The patient overall is feeling much better. He had a good day yesterday but feels tired today. He starts cardiac rehabilitation October 13. His wife noticed him breathing with his mouth open and feels like he may be short of breath at times. The patient denies any dyspnea or dyspnea on exertion, orthopnea or chest pain. His cellulitis has resolved.  Allergies  Allergen Reactions  . Ceclor [Cefaclor] Rash  . Penicillins Rash     Current Outpatient Prescriptions  Medication Sig Dispense Refill  . albuterol (PROVENTIL HFA;VENTOLIN HFA) 108 (90 BASE) MCG/ACT inhaler Inhale 1-2 puffs into the lungs every 6 (six) hours as needed for wheezing or shortness of breath.      . allopurinol (ZYLOPRIM) 300 MG tablet Take 300 mg by mouth daily.      Marland Kitchen. amiodarone (PACERONE) 400 MG tablet Take 400 mg by mouth daily.      Marland Kitchen. aspirin 81 MG tablet Take 81 mg by mouth daily.      . Canagliflozin-Metformin HCl (INVOKAMET) 50-1000 MG TABS Take 1 tablet by mouth 2 (two) times daily.      . Cholecalciferol (VITAMIN D-3) 1000 UNITS CAPS Take 4,000 Units by mouth daily.      . metoprolol tartrate (LOPRESSOR) 25 MG tablet Take 1 tablet (25 mg total) by mouth 2 (two) times daily.  60 tablet  1  . ondansetron (ZOFRAN) 4 MG tablet Take 1 tablet (4 mg total) by mouth every 8 (eight) hours as needed for nausea or vomiting.   20 tablet  0  . pyridoxine (B-6) 100 MG tablet Take 200 mg by mouth daily.      . sildenafil (VIAGRA) 50 MG tablet Take 50 mg by mouth as needed for erectile dysfunction.       Marland Kitchen. testosterone (ANDROGEL) 50 MG/5GM GEL Place 10 g onto the skin daily.      Marland Kitchen. tiZANidine (ZANAFLEX) 4 MG tablet Take 4 mg by mouth 2 (two) times daily as needed for muscle spasms.       . traMADol (ULTRAM) 50 MG tablet Take 1 tablet (50 mg total) by mouth every 6 (six) hours as needed for moderate pain.  30 tablet  0  . vitamin C (ASCORBIC ACID) 250 MG tablet Take 500 mg by mouth daily.      Marland Kitchen. warfarin (COUMADIN) 2.5 MG tablet Take 1-2 tablets by mouth daily as directed by coumadin clinic  50 tablet  1  . simvastatin (ZOCOR) 80 MG tablet Take 80 mg by mouth daily. ON HOLD UNTIL AMIODARONE IS FINISHED (10/09/13)       No current facility-administered medications for this visit.    Past Medical History  Diagnosis Date  . Obesity (BMI 30.0-34.9) 02/25/2013  . Atherosclerotic heart disease of native coronary artery with unstable angina pectoris 02/25/2013    3 Vs CAD (despite Neg Myoview) Cath 08/2013: pLAD Ca2++ 90%,mCx (  after OM1) 95-99%, dRCA-ostRPDA 95% --> referred for cabg  . H/O unstable angina 08/2013    See Cath Above --CABG  . 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 Essential hypertension 02/25/2013  . Dyslipidemia, goal LDL below 70 08/30/2013    3V CAD  . Diabetes mellitus type 2 with complications     3V CAD  . Ascending aorta dilatation 06/27/2013    Max reported diameter 4.6 cm by CTA   . Postoperative atrial fibrillation 09/13/2013  . Arthritis     Past Surgical History  Procedure Laterality Date  . Cardiac catheterization  08/2013    3V CAD - despite Negative Myoview for Unstable Angina -- pLDA 95%, mCx 95-99%, dRCA-ostRPDA 95%  . Nasal septum surgery    . Scrotum exploration    . Coronary artery  bypass graft N/A 09/12/2013    Procedure: CORONARY ARTERY BYPASS GRAFTING (CABG), on pump, times five, using left internal mammary artery, right greater saphenous vein hervested endoscopically.;  Surgeon: Purcell Nails, MD;  Location: [facility] OR;  Service: Open Heart Surgery;  Laterality: N/A;  LIMA-LAD; SEQ SVG-INTERM.-OM1; SEQ SVG-PD-PL   . Intraoperative transesophageal echocardiogram N/A 09/12/2013    Procedure: INTRAOPERATIVE TRANSESOPHAGEAL ECHOCARDIOGRAM;  Surgeon: Purcell Nails, MD;  Location: [facility] OR;  Service: Open Heart Surgery;  Laterality: N/A;    Family History  Problem Relation Age of Onset  . Heart disease Mother   . Heart disease Maternal Grandfather     History   Social History  . Marital Status: Married    Spouse Name: N/A    Number of Children: N/A  . Years of Education: N/A   Occupational History  . Not on file.   Social History Main Topics  . Smoking status: Never Smoker   . Smokeless tobacco: Never Used  . Alcohol Use: No  . Drug Use: No  . Sexual Activity: Not on file   Other Topics Concern  . Not on file   Social History Narrative  . No narrative on file    ROS: See history of present illness otherwise negative  BP 118/70  Pulse 70  Ht 6\' 3"  (1.905 m)  Wt 251 lb 12.8 oz (114.216 kg)  BMI 31.47 kg/m2  PHYSICAL EXAM: Obese, in no acute distress. Neck: No JVD, HJR, Bruit, or thyroid enlargement  Lungs: Decreased breath sounds at the left lung base otherwise No tachypnea, clear without wheezing, rales, or rhonchi  Cardiovascular: RRR, PMI not displaced, heart sounds normal, no murmurs, gallops, bruit, thrill, or heave.  Abdomen: BS normal. Soft without organomegaly, masses, lesions or tenderness.  Extremities: Appendectomy site looks good without evidence of infection, small hematoma resolving, mild edema in the ankles on the right otherwise lower extremities without cyanosis, clubbing or edema. Good distal pulses bilateral  SKin: Warm, no  lesions or rashes   Musculoskeletal: No deformities  Neuro: no focal signs   Wt Readings from Last 3 Encounters:  10/09/13 251 lb 12.8 oz (114.216 kg)  10/07/13 245 lb (111.131 kg)  09/27/13 245 lb (111.131 kg)     EKG: Normal sinus rhythm at 70 beats per minute nonspecific ST-T wave changes, no acute change

## 2013-10-09 NOTE — Assessment & Plan Note (Signed)
Patient maintaining normal sinus rhythm on amiodarone. Reduce dose to 200 mg once daily. Followup with Dr. Rennis GoldenHilty in 4-6 weeks.

## 2013-10-14 ENCOUNTER — Ambulatory Visit: Payer: BC Managed Care – PPO | Admitting: Thoracic Surgery (Cardiothoracic Vascular Surgery)

## 2013-10-14 ENCOUNTER — Ambulatory Visit (INDEPENDENT_AMBULATORY_CARE_PROVIDER_SITE_OTHER): Payer: BC Managed Care – PPO | Admitting: Pharmacist Clinician (PhC)/ Clinical Pharmacy Specialist

## 2013-10-14 DIAGNOSIS — Z7901 Long term (current) use of anticoagulants: Secondary | ICD-10-CM

## 2013-10-14 DIAGNOSIS — I480 Paroxysmal atrial fibrillation: Secondary | ICD-10-CM

## 2013-10-14 DIAGNOSIS — I4891 Unspecified atrial fibrillation: Secondary | ICD-10-CM

## 2013-10-14 LAB — POCT INR: INR: 1.4

## 2013-10-15 ENCOUNTER — Telehealth: Payer: Self-pay | Admitting: *Deleted

## 2013-10-15 NOTE — Telephone Encounter (Signed)
Faxed updated cardiac rehabilitation physician referral form with ICD10 - Z95.1

## 2013-10-21 ENCOUNTER — Ambulatory Visit (INDEPENDENT_AMBULATORY_CARE_PROVIDER_SITE_OTHER): Payer: BC Managed Care – PPO | Admitting: Pharmacist Clinician (PhC)/ Clinical Pharmacy Specialist

## 2013-10-21 DIAGNOSIS — Z7901 Long term (current) use of anticoagulants: Secondary | ICD-10-CM

## 2013-10-21 DIAGNOSIS — I480 Paroxysmal atrial fibrillation: Secondary | ICD-10-CM

## 2013-10-21 DIAGNOSIS — I4891 Unspecified atrial fibrillation: Secondary | ICD-10-CM

## 2013-10-21 LAB — POCT INR: INR: 1.8

## 2013-10-28 ENCOUNTER — Ambulatory Visit: Payer: BC Managed Care – PPO | Admitting: Pharmacist Clinician (PhC)/ Clinical Pharmacy Specialist

## 2013-10-30 ENCOUNTER — Ambulatory Visit (INDEPENDENT_AMBULATORY_CARE_PROVIDER_SITE_OTHER): Payer: BC Managed Care – PPO | Admitting: Pharmacist Clinician (PhC)/ Clinical Pharmacy Specialist

## 2013-10-30 DIAGNOSIS — I4891 Unspecified atrial fibrillation: Secondary | ICD-10-CM

## 2013-10-30 DIAGNOSIS — Z7901 Long term (current) use of anticoagulants: Secondary | ICD-10-CM

## 2013-10-30 DIAGNOSIS — I480 Paroxysmal atrial fibrillation: Secondary | ICD-10-CM

## 2013-10-30 LAB — POCT INR: INR: 1.8

## 2013-11-01 ENCOUNTER — Other Ambulatory Visit: Payer: Self-pay | Admitting: Thoracic Surgery (Cardiothoracic Vascular Surgery)

## 2013-11-01 DIAGNOSIS — I25119 Atherosclerotic heart disease of native coronary artery with unspecified angina pectoris: Secondary | ICD-10-CM

## 2013-11-04 ENCOUNTER — Ambulatory Visit (INDEPENDENT_AMBULATORY_CARE_PROVIDER_SITE_OTHER): Payer: Self-pay | Admitting: Thoracic Surgery (Cardiothoracic Vascular Surgery)

## 2013-11-04 ENCOUNTER — Ambulatory Visit
Admission: RE | Admit: 2013-11-04 | Discharge: 2013-11-04 | Disposition: A | Discharge status: Home / Self Care | Payer: BC Managed Care – PPO | Source: Ambulatory Visit | Attending: Thoracic Surgery (Cardiothoracic Vascular Surgery) | Admitting: Thoracic Surgery (Cardiothoracic Vascular Surgery)

## 2013-11-04 ENCOUNTER — Encounter: Payer: Self-pay | Admitting: Thoracic Surgery (Cardiothoracic Vascular Surgery)

## 2013-11-04 VITALS — BP 108/84 | HR 69 | Ht 75.0 in | Wt 251.0 lb

## 2013-11-04 DIAGNOSIS — I25119 Atherosclerotic heart disease of native coronary artery with unspecified angina pectoris: Secondary | ICD-10-CM

## 2013-11-04 DIAGNOSIS — Z951 Presence of aortocoronary bypass graft: Secondary | ICD-10-CM

## 2013-11-04 NOTE — Progress Notes (Signed)
[address]       [place] [id number]             [phone]     CARDIOTHORACIC SURGERY OFFICE NOTE  Referring Provider is Chrystie NoseHilty, Kenneth C., MD PCP is Jolene ProvostHAIMES,DAVID M, MD   HPI:  Patient returns for routine follow-up status post coronary artery bypass grafting 5 on 09/12/2013. He was last seen here in the office on 10/07/2013. Since then he has done very well. He has been participating in outpatient cardiac rehabilitation program and making excellent progress. He reports no significant residual pain in his chest with exception of hyperesthesia and numbness involving the anterior wall of the left chest consistent with recent left internal mammary artery harvest and grafting. He has had some intermittent spells of numbness and weakness in both lower legs, right more than left. This seems to be positional and occurs most commonly when he sleeps on his right side. His exercise tolerance continues to improve. He denies any exertional chest discomfort. He has not had any palpitations or dizzy spells. The remainder of his review of systems is unremarkable.   Current Outpatient Prescriptions  Medication Sig Dispense Refill  . albuterol (PROVENTIL HFA;VENTOLIN HFA) 108 (90 BASE) MCG/ACT inhaler Inhale 1-2 puffs into the lungs every 6 (six) hours as needed for wheezing or shortness of breath.      . allopurinol (ZYLOPRIM) 300 MG tablet Take 300 mg by mouth daily.      Marland Kitchen. amiodarone (PACERONE) 200 MG tablet Take 200 mg by mouth daily.      Marland Kitchen. aspirin 81 MG tablet Take 81 mg by mouth daily.      . Canagliflozin-Metformin HCl (INVOKAMET) 50-1000 MG TABS Take 1 tablet by mouth 2 (two) times daily.      . Cholecalciferol (VITAMIN D-3) 1000 UNITS CAPS Take 4,000 Units by mouth daily.      . metoprolol tartrate (LOPRESSOR) 25 MG tablet Take 1 tablet (25 mg total) by mouth 2 (two) times daily.  60 tablet  1  . ondansetron (ZOFRAN) 4 MG tablet Take 1 tablet (4 mg total) by mouth every  8 (eight) hours as needed for nausea or vomiting.  20 tablet  0  . pyridoxine (B-6) 100 MG tablet Take 200 mg by mouth daily.      . sildenafil (VIAGRA) 50 MG tablet Take 50 mg by mouth as needed for erectile dysfunction.       . simvastatin (ZOCOR) 80 MG tablet Take 80 mg by mouth daily. ON HOLD UNTIL AMIODARONE IS FINISHED (10/09/13)      . testosterone (ANDROGEL) 50 MG/5GM GEL Place 10 g onto the skin daily.      Marland Kitchen. tiZANidine (ZANAFLEX) 4 MG tablet Take 4 mg by mouth 2 (two) times daily as needed for muscle spasms.       . traMADol (ULTRAM) 50 MG tablet Take 1 tablet (50 mg total) by mouth every 6 (six) hours as needed for moderate pain.  30 tablet  0  . vitamin C (ASCORBIC ACID) 250 MG tablet Take 500 mg by mouth daily.      Marland Kitchen. warfarin (COUMADIN) 2.5 MG tablet Take 1-2 tablets by mouth daily as directed by coumadin clinic  50 tablet  1   No current facility-administered medications for this visit.      Physical Exam:   BP 108/84  Pulse 69  Ht 6\' 3"  (1.905 m)  Wt 251 lb (113.853 kg)  BMI 31.37  kg/m2  SpO2 97%  General:  well  Chest:   clear  CV:   Regular rate and rhythm without murmur  Incisions:  Healing nicely, sternum is stable  Abdomen:  Soft and nontender  Extremities:  Warm and well-perfused  Diagnostic Tests:  CHEST 2 VIEW  COMPARISON: October 09, 2013  FINDINGS:  There is mild scarring in the left lower lobe and perihilar regions.  There is no edema or consolidation. Heart size and pulmonary  vascularity are normal. No adenopathy. No pneumothorax. No bone  lesions. Patient is status post coronary artery bypass grafting.  IMPRESSION:  Scattered areas of scarring on the left. No edema or consolidation.  No apparent pneumothorax.  Electronically Signed  By: Bretta BangWilliam Woodruff M.D.  On: 11/04/2013 10:55    Impression:  Patient is doing very well in early 2 months status post coronary artery bypass grafting 5. He has had no clinical recurrence of atrial  fibrillation.   Plan:  I have encouraged patient to continue to increase his physical activity as tolerated. We have not made any changes to the patient's current medications. I have suggested that it might be reasonable to stop amiodarone when his current prescription runs out. I would recommend the possibility of stopping Coumadin after 3 months if he continues to maintain sinus rhythm. The patient will continue to follow-up with Dr. Rennis GoldenHilty. We will plan to see him back next September for routine follow-up proximally 1 year following his surgery.  We will obtain f/u CTA chest at that time to f/u mild dilatation of the ascending thoracic aorta.    Salvatore Decentlarence H. Cornelius Moraswen, MD 11/04/2013 1:13 PM

## 2013-11-04 NOTE — Patient Instructions (Signed)
The patient should continue to avoid any heavy lifting or strenuous use of arms or shoulders for at least a total of three months from the time of surgery.  

## 2013-11-13 ENCOUNTER — Ambulatory Visit (INDEPENDENT_AMBULATORY_CARE_PROVIDER_SITE_OTHER): Payer: BC Managed Care – PPO | Admitting: Internal Medicine

## 2013-11-13 ENCOUNTER — Encounter: Payer: Self-pay | Admitting: Internal Medicine

## 2013-11-13 ENCOUNTER — Ambulatory Visit (INDEPENDENT_AMBULATORY_CARE_PROVIDER_SITE_OTHER): Payer: BC Managed Care – PPO | Admitting: Pharmacist Clinician (PhC)/ Clinical Pharmacy Specialist

## 2013-11-13 VITALS — BP 122/90 | HR 60 | Ht 75.0 in | Wt 250.4 lb

## 2013-11-13 DIAGNOSIS — I480 Paroxysmal atrial fibrillation: Secondary | ICD-10-CM

## 2013-11-13 DIAGNOSIS — E785 Hyperlipidemia, unspecified: Secondary | ICD-10-CM

## 2013-11-13 DIAGNOSIS — E669 Obesity, unspecified: Secondary | ICD-10-CM

## 2013-11-13 DIAGNOSIS — Z951 Presence of aortocoronary bypass graft: Secondary | ICD-10-CM

## 2013-11-13 DIAGNOSIS — I712 Thoracic aortic aneurysm, without rupture: Secondary | ICD-10-CM

## 2013-11-13 DIAGNOSIS — Z7901 Long term (current) use of anticoagulants: Secondary | ICD-10-CM

## 2013-11-13 DIAGNOSIS — I4891 Unspecified atrial fibrillation: Secondary | ICD-10-CM

## 2013-11-13 DIAGNOSIS — I10 Essential (primary) hypertension: Secondary | ICD-10-CM

## 2013-11-13 DIAGNOSIS — I77810 Thoracic aortic ectasia: Secondary | ICD-10-CM

## 2013-11-13 LAB — POCT INR: INR: 2.4

## 2013-11-13 MED ORDER — ATORVASTATIN CALCIUM 80 MG PO TABS: 80.0000 mg | ORAL_TABLET | Freq: Every day | ORAL | Status: DC

## 2013-11-13 MED ORDER — HYDROCHLOROTHIAZIDE 25 MG PO TABS: 25.0000 mg | ORAL_TABLET | Freq: Every day | ORAL | Status: DC

## 2013-11-13 NOTE — Progress Notes (Signed)
OFFICE NOTE  Chief Complaint:  Establish new cardiologist  Primary Care Physician: Frank Provost, MD  HPI:  Frank Nigh. is a pleasant 63 year old male who was recently followed [facility] Luberta Robertson [facility] [place]. 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  Frank Moreno previously worked for the [employer] and is now retired. 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  He does have a history of coronary artery disease and was found to have a 50% mid circumflex stenosis by cath and 1999. At that time he was on no medical therapy for coronary disease and subsequently was placed on a statin, beta blocker and ARB and aspirin. He since done well and had no coronary events over the past 16 years. 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 He reports his cholesterol as been well controlled and that his blood pressure is usually within normal limits. He is active but does not regularly exercise. He reports sleeping well at night denies any snoring, gasping or other obstructive sleep apnea symptoms.  He did have a nuclear stress test in 2013 which was negative for ischemia. His last lipid profile was in December 2013 which showed total cholesterol 126, triglycerides 239, HDL 33 and LDL 45.  EF was 61% by nuclear stress testing in June 2013, which was nonischemic.  He ultimately was referred for cardiac catheterization and found to have multivessel coronary artery disease and is now status post CABG x5 banding of the proximal descending thoracic aorta for mild dilatation of the ascending aorta on 09/12/13. Postop he had atrial fibrillation treated with amiodarone and Coumadin. He also developed cellulitis and hematoma around the right leg SVG extraction site. He was placed on Keflex and followed by Dr. Cornelius Moras. He also had acute diastolic heart failure postop treated with Lasix. His amiodarone was to be reduced to 200 mg  daily but they have not picked up the new prescription yet.  Frank Moreno returns today for follow-up. In the interim he is see my partner as well as Ms. Lentze and Dr. Cornelius Moras a couple of times for an infected endovascular harvest site. He reports marked improvement in his symptoms in cardiac rehabilitation. He denies any shortness of breath or chest pain. There appears to be no recurrence of his atrial fibrillation which was postoperative. He is on low-dose amiodarone 200 mg daily. He continues on warfarin and his INR is therapeutic today. He has been off of simvastatin due to possible interaction with his amiodarone. Recently his wife has noted that his blood pressure has been climbing and he's developed some lower extremity swelling.  PMHx:  Past Medical History  Diagnosis Date  . Obesity (BMI 30.0-34.9) 02/25/2013  . Atherosclerotic heart disease of native coronary artery with unstable angina pectoris 02/25/2013    3 Vs CAD (despite Neg Myoview) Cath 08/2013: pLAD Ca2++ 90%,mCx (after OM1) 95-99%, dRCA-ostRPDA 95% --> referred for cabg  . H/O unstable angina 08/2013    See Cath Above --CABG  . 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 Essential hypertension 02/25/2013  . Dyslipidemia, goal LDL below 70 08/30/2013    3V CAD  . Diabetes mellitus type 2 with complications     3V CAD  . Ascending aorta dilatation 06/27/2013  Max reported diameter 4.6 cm by CTA   . Postoperative atrial fibrillation 09/13/2013  . Arthritis     Past Surgical History  Procedure Laterality Date  . Cardiac catheterization  08/2013    3V CAD - despite Negative Myoview for Unstable Angina -- pLDA 95%, mCx 95-99%, dRCA-ostRPDA 95%  . Nasal septum surgery    . Scrotum exploration    . Coronary artery bypass graft N/A 09/12/2013    Procedure: CORONARY ARTERY BYPASS GRAFTING (CABG), on pump, times five, using left internal  mammary artery, right greater saphenous vein hervested endoscopically.;  Surgeon: Purcell Nailslarence H Owen, MD;  Location: [facility] OR;  Service: Open Heart Surgery;  Laterality: N/A;  LIMA-LAD; SEQ SVG-INTERM.-OM1; SEQ SVG-PD-PL   . Intraoperative transesophageal echocardiogram N/A 09/12/2013    Procedure: INTRAOPERATIVE TRANSESOPHAGEAL ECHOCARDIOGRAM;  Surgeon: Purcell Nailslarence H Owen, MD;  [facility];  Service: Open Heart Surgery;  Laterality: N/A;    FAMHx:  Family History  Problem Relation Age of Onset  . Heart disease Mother   . Heart disease Maternal Grandfather     SOCHx:   reports that he has never smoked. He has never used smokeless tobacco. He reports that he does not drink alcohol or use illicit drugs.  ALLERGIES:  Allergies  Allergen Reactions  . Ceclor [Cefaclor] Rash  . Penicillins Rash    ROS: A comprehensive review of systems was negative except for: Cardiovascular: positive for lower extremity edema  HOME MEDS: Current Outpatient Prescriptions  Medication Sig Dispense Refill  . albuterol (PROVENTIL HFA;VENTOLIN HFA) 108 (90 BASE) MCG/ACT inhaler Inhale 1-2 puffs into the lungs every 6 (six) hours as needed for wheezing or shortness of breath.    . allopurinol (ZYLOPRIM) 300 MG tablet Take 300 mg by mouth daily.    Marland Kitchen. aspirin 81 MG tablet Take 81 mg by mouth daily.    . Canagliflozin-Metformin HCl (INVOKAMET) 50-1000 MG TABS Take 1 tablet by mouth 2 (two) times daily.    . Cholecalciferol (VITAMIN D-3) 1000 UNITS CAPS Take 4,000 Units by mouth daily.    Marland Kitchen. levothyroxine (SYNTHROID, LEVOTHROID) 50 MCG tablet Take 50 mcg by mouth daily before breakfast.    . metoprolol tartrate (LOPRESSOR) 25 MG tablet Take 1 tablet (25 mg total) by mouth 2 (two) times daily. 60 tablet 1  . ondansetron (ZOFRAN) 4 MG tablet Take 1 tablet (4 mg total) by mouth every 8 (eight) hours as needed for nausea or vomiting. 20 tablet 0  . pyridoxine (B-6) 100 MG tablet Take 200 mg by mouth daily.    . sildenafil  (VIAGRA) 50 MG tablet Take 50 mg by mouth as needed for erectile dysfunction.     . simvastatin (ZOCOR) 80 MG tablet Take 80 mg by mouth daily. ON HOLD UNTIL AMIODARONE IS FINISHED (10/09/13)    . tiZANidine (ZANAFLEX) 4 MG tablet Take 4 mg by mouth 2 (two) times daily as needed for muscle spasms.     . traMADol (ULTRAM) 50 MG tablet Take 1 tablet (50 mg total) by mouth every 6 (six) hours as needed for moderate pain. 30 tablet 0  . vitamin C (ASCORBIC ACID) 250 MG tablet Take 500 mg by mouth daily.    Marland Kitchen. warfarin (COUMADIN) 2.5 MG tablet Take 1-2 tablets by mouth daily as directed by coumadin clinic 50 tablet 1  . atorvastatin (LIPITOR) 80 MG tablet Take 1 tablet (80 mg total) by mouth daily. 30 tablet 6  . hydrochlorothiazide (HYDRODIURIL) 25 MG tablet Take 1 tablet (25 mg  total) by mouth daily. 30 tablet 6   No current facility-administered medications for this visit.    LABS/IMAGING: Results for orders placed or performed in visit on 11/13/13 (from the past 48 hour(s))  POCT INR     Status: None   Collection Time: 11/13/13  4:04 PM  Result Value Ref Range   INR 2.4    No results found.  VITALS: BP 122/90 mmHg  Pulse 60  Ht 6\' 3"  (1.905 m)  Wt 250 lb 6.4 oz (113.581 kg)  BMI 31.30 kg/m2  EXAM: General appearance: alert and no distress Neck: no carotid bruit and no JVD Lungs: clear to auscultation bilaterally Heart: regular rate and rhythm, S1, S2 normal, no murmur, click, rub or gallop Abdomen: soft, non-tender; bowel sounds normal; no masses,  no organomegaly Extremities: extremities normal, atraumatic, no cyanosis or edema Pulses: 2+ and symmetric Skin: Skin color, texture, turgor normal. No rashes or lesions Neurologic: Grossly normal Psych: Mood, affect normal  EKG: Sinus rhythm at 60  ASSESSMENT: 1. Coronary artery disease status post 5 vessel bypass and ascending aorta banding 2. Hypertension-controlled 3. Dyslipidemia 4. Diabetes type  2 5. Obesity 6. Paroxysmal atrial fibrillation  PLAN: 1.   Frank Moreno is recovering nicely from bypass surgery. I feel that his atrial fibrillation was solely related to cardiac surgery. At this point I would recommend discontinuing amiodarone. He has been anticoagulated on warfarin for 2 months. I would recommend continuing warfarin for another month and if he has no recurrent atrial fibrillation, then we can discontinue it and maintain him on aspirin. He should be on a statin as his simvastatin was stopped due to interaction with amiodarone. While we can continue that,  It may be difficult to start the 80 mg simvastatin dose due to FDA restrictions. I would recommend switching to atorvastatin 80 mg daily. He does note that his blood pressure has been climbing somewhat and I would recommend starting HCTZ 25 mg daily in addition to his current medications to also help with some of his leg edema. What it appeared that his area of cellulitis has healed.  Thank you for allowing me to participate in his care. Plan to see him back in 6 months.  Chrystie NoseKenneth C. Hilty, MD, [facility] Attending Cardiologist [facility]  HILTY,Kenneth C 11/13/2013, 5:29 PM

## 2013-11-13 NOTE — Patient Instructions (Signed)
Your physician wants you to follow-up in: 6 months with Dr. Rennis GoldenHilty. You will receive a reminder letter in the mail two months in advance. If you don't receive a letter, please call our office to schedule the follow-up appointment.  Your physician has recommended you make the following change in your medication..  1. STOP amiodarone 2. START atorvastatin 80mg  once daily (cholesterol) 3. START hydrochlorothiazide 25mg  once daily (fluid, blood pressure)

## 2013-11-20 ENCOUNTER — Telehealth: Payer: Self-pay | Admitting: Internal Medicine

## 2013-11-20 ENCOUNTER — Encounter: Payer: Self-pay | Admitting: *Deleted

## 2013-11-20 NOTE — Telephone Encounter (Signed)
Letter composed - sent to MyChart and faxed.

## 2013-11-20 NOTE — Telephone Encounter (Signed)
Pt need to have teeth cleaned on 11-25-13. The dentis wants clarence on that,pt just had heart surgery 2 months ago.Please fax it to the [phone].

## 2013-11-20 NOTE — Telephone Encounter (Signed)
Notified wife.

## 2013-11-20 NOTE — Telephone Encounter (Signed)
Patient is to have dental cleaning - this OK Dr. Veneta PentonNovak Fax to patient's wife at their home fax [phone]

## 2013-11-20 NOTE — Telephone Encounter (Signed)
agree

## 2013-11-22 ENCOUNTER — Ambulatory Visit (INDEPENDENT_AMBULATORY_CARE_PROVIDER_SITE_OTHER): Payer: BC Managed Care – PPO | Admitting: Pharmacist Clinician (PhC)/ Clinical Pharmacy Specialist

## 2013-11-22 DIAGNOSIS — Z7901 Long term (current) use of anticoagulants: Secondary | ICD-10-CM

## 2013-11-22 DIAGNOSIS — I480 Paroxysmal atrial fibrillation: Secondary | ICD-10-CM

## 2013-11-22 LAB — POCT INR: INR: 2.1

## 2013-11-23 ENCOUNTER — Other Ambulatory Visit: Payer: Self-pay | Admitting: Pharmacist Clinician (PhC)/ Clinical Pharmacy Specialist

## 2013-11-23 MED ORDER — WARFARIN SODIUM 2.5 MG PO TABS: ORAL_TABLET | ORAL | Status: DC

## 2013-11-28 ENCOUNTER — Other Ambulatory Visit: Payer: Self-pay | Admitting: Physician Assistant

## 2013-12-19 ENCOUNTER — Encounter (HOSPITAL_COMMUNITY): Payer: Self-pay | Admitting: Internal Medicine

## 2013-12-23 ENCOUNTER — Telehealth: Payer: Self-pay | Admitting: Pharmacist Clinician (PhC)/ Clinical Pharmacy Specialist

## 2013-12-23 NOTE — Telephone Encounter (Signed)
Pt was told to call b/c was started on Zpak for ear infection.  Is due to stop warfarin tomorrow, 3 months post procedure.  Assured pt that azithromycin not a concern with warfarin, less so because his last dose of warfarin will be tomorrow.  Pt voiced understanding

## 2014-03-26 ENCOUNTER — Ambulatory Visit: Payer: BC Managed Care – PPO | Admitting: Internal Medicine

## 2014-03-28 ENCOUNTER — Telehealth: Payer: Self-pay | Admitting: Internal Medicine

## 2014-03-28 NOTE — Telephone Encounter (Signed)
Mrs Frank Moreno is calling because Mr. Frank Moreno bp is low every time he stands up . Just got of the hospital . Please call  Thanks

## 2014-03-28 NOTE — Telephone Encounter (Signed)
Spoke with pt wife, he just got out of [facility] for a broken leg and low bp issues. He has been discharged and is now home. When standing his bp is 90/70 and his pulse will increase to 127. He has been forcing fluids. He does have dizziness when standing. Discussed with dr Rennis Goldenhilty, pt will be seen Monday at 3:45p

## 2014-03-31 ENCOUNTER — Ambulatory Visit: Payer: BC Managed Care – PPO | Admitting: Cardiology

## 2014-03-31 ENCOUNTER — Ambulatory Visit (INDEPENDENT_AMBULATORY_CARE_PROVIDER_SITE_OTHER): Payer: BC Managed Care – PPO | Admitting: Internal Medicine

## 2014-03-31 ENCOUNTER — Encounter: Payer: Self-pay | Admitting: Internal Medicine

## 2014-03-31 VITALS — BP 92/68 | HR 105 | Wt 239.0 lb

## 2014-03-31 DIAGNOSIS — I951 Orthostatic hypotension: Secondary | ICD-10-CM

## 2014-03-31 DIAGNOSIS — I712 Thoracic aortic aneurysm, without rupture: Secondary | ICD-10-CM | POA: Diagnosis not present

## 2014-03-31 DIAGNOSIS — Z79899 Other long term (current) drug therapy: Secondary | ICD-10-CM | POA: Diagnosis not present

## 2014-03-31 DIAGNOSIS — R5383 Other fatigue: Secondary | ICD-10-CM

## 2014-03-31 DIAGNOSIS — Z951 Presence of aortocoronary bypass graft: Secondary | ICD-10-CM

## 2014-03-31 DIAGNOSIS — R0602 Shortness of breath: Secondary | ICD-10-CM | POA: Diagnosis not present

## 2014-03-31 DIAGNOSIS — Y249XXA Unspecified firearm discharge, undetermined intent, initial encounter: Secondary | ICD-10-CM

## 2014-03-31 DIAGNOSIS — T148 Other injury of unspecified body region: Secondary | ICD-10-CM

## 2014-03-31 DIAGNOSIS — I77810 Thoracic aortic ectasia: Secondary | ICD-10-CM

## 2014-03-31 DIAGNOSIS — R079 Chest pain, unspecified: Secondary | ICD-10-CM | POA: Diagnosis not present

## 2014-03-31 LAB — CBC
HEMATOCRIT: 46.9 % (ref 39.0–52.0)
Hemoglobin: 15.1 g/dL (ref 13.0–17.0)
MCH: 29.9 pg (ref 26.0–34.0)
MCHC: 32.2 g/dL (ref 30.0–36.0)
MCV: 92.9 fL (ref 78.0–100.0)
MPV: 9.8 fL (ref 8.6–12.4)
Platelets: 378 10*3/uL (ref 150–400)
RBC: 5.05 MIL/uL (ref 4.22–5.81)
RDW: 17 % — AB (ref 11.5–15.5)
WBC: 12.4 10*3/uL — ABNORMAL HIGH (ref 4.0–10.5)

## 2014-03-31 LAB — COMPREHENSIVE METABOLIC PANEL
ALK PHOS: 83 U/L (ref 39–117)
ALT: 30 U/L (ref 0–53)
AST: 24 U/L (ref 0–37)
Albumin: 4.1 g/dL (ref 3.5–5.2)
BUN: 28 mg/dL — AB (ref 6–23)
CALCIUM: 9.5 mg/dL (ref 8.4–10.5)
CO2: 27 mEq/L (ref 19–32)
Chloride: 103 mEq/L (ref 96–112)
Creat: 1.15 mg/dL (ref 0.50–1.35)
GLUCOSE: 108 mg/dL — AB (ref 70–99)
Potassium: 4.9 mEq/L (ref 3.5–5.3)
Sodium: 138 mEq/L (ref 135–145)
Total Bilirubin: 1.6 mg/dL — ABNORMAL HIGH (ref 0.2–1.2)
Total Protein: 7 g/dL (ref 6.0–8.3)

## 2014-03-31 NOTE — Patient Instructions (Addendum)
Your physician recommends that you return for lab work.   CT Angiography (CTA), is a special type of CT scan that uses a computer to produce multi-dimensional views of major blood vessels throughout the body. In CT angiography, a contrast material is injected through an IV to help visualize the blood vessels >> [facility]  Your physician has recommended you make the following change in your medication: STOP metoprolol.   Your physician recommends that you schedule a follow-up appointment in: 2-3 weeks (OK to add to DOD slot if needed)  Please hold off on physical therapy. Left message on wife cell with this info.

## 2014-04-01 ENCOUNTER — Telehealth: Payer: Self-pay | Admitting: Internal Medicine

## 2014-04-01 ENCOUNTER — Ambulatory Visit
Admission: RE | Admit: 2014-04-01 | Discharge: 2014-04-01 | Disposition: A | Discharge status: Home / Self Care | Payer: BC Managed Care – PPO | Source: Ambulatory Visit | Attending: Internal Medicine | Admitting: Internal Medicine

## 2014-04-01 DIAGNOSIS — Y249XXA Unspecified firearm discharge, undetermined intent, initial encounter: Secondary | ICD-10-CM | POA: Insufficient documentation

## 2014-04-01 DIAGNOSIS — I951 Orthostatic hypotension: Secondary | ICD-10-CM

## 2014-04-01 DIAGNOSIS — R079 Chest pain, unspecified: Secondary | ICD-10-CM

## 2014-04-01 DIAGNOSIS — R0602 Shortness of breath: Secondary | ICD-10-CM

## 2014-04-01 LAB — D-DIMER, QUANTITATIVE: D-Dimer, Quant: 3.01 ug/mL-FEU — ABNORMAL HIGH (ref 0.00–0.48)

## 2014-04-01 MED ORDER — IOPAMIDOL (ISOVUE-370) INJECTION 76%
100.0000 mL | Freq: Once | INTRAVENOUS | Status: AC | PRN
  Administered 2014-04-01: 100 mL via INTRAVENOUS
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
## 2014-04-01 NOTE — Telephone Encounter (Signed)
This message from the Answering Services: Pt returning call from yesterday,does not know who called.

## 2014-04-01 NOTE — Telephone Encounter (Signed)
Spoke with patient's wife and informed her that Dr. Rennis GoldenHilty recommended holding off on PT until orthostatic hypotension has resolved or cause is known. She voiced understanding. Transferred to scheduler to set up appointment for follow up

## 2014-04-02 ENCOUNTER — Telehealth: Payer: Self-pay | Admitting: Internal Medicine

## 2014-04-02 NOTE — Telephone Encounter (Signed)
Rhonda with [facility] physical therapy was informed by patient's wife, Burna MortimerWanda, that patient was advised to hold off on PT until hypotension could be assessed/treated/managed. Confirmed this with Bjorn LoserRhonda as being a correct statement. No further action necessary

## 2014-04-07 ENCOUNTER — Telehealth: Payer: Self-pay | Admitting: Internal Medicine

## 2014-04-07 NOTE — Telephone Encounter (Signed)
Left VM on wife's number that Dr. Rennis GoldenHilty OK'ed resuming PT based on BP improvements and improvements in symptoms.

## 2014-04-07 NOTE — Telephone Encounter (Signed)
Bp is better - if he wants to restart PT that is fine.    Dr. HRexene Edison

## 2014-04-07 NOTE — Telephone Encounter (Signed)
Pt's wife is calling in to give BelgiumJenna an update on the pt's BP readings. Please call back  Thanks

## 2014-04-07 NOTE — Telephone Encounter (Signed)
Wife wanted to report BP & HR readings since stopping metoprolol (in hope that patient can resume PT)  Readings go reverse chronological order.  125/83  107 124/93  90 121/79  120 125/93  115 133/98  111 115/71  70 128/93  101 104/67  122 119/74  95  Patient reports he is feeling much better since stopped metoprolol Will defer to Dr. Rennis GoldenHilty to advise on if can resume PT

## 2014-04-10 ENCOUNTER — Telehealth: Payer: Self-pay | Admitting: Internal Medicine

## 2014-04-10 NOTE — Telephone Encounter (Signed)
PHYSICAL THERAPIST NEED A VERBAL TO RESTART THERAPY. RN INFORMED DR HILTY V/O TO RESUME THERAPY ON 04/07/14 SHE VERBALIZED UNDERSTANDING.

## 2014-04-16 ENCOUNTER — Encounter: Payer: Self-pay | Admitting: Internal Medicine

## 2014-04-16 ENCOUNTER — Ambulatory Visit (INDEPENDENT_AMBULATORY_CARE_PROVIDER_SITE_OTHER): Payer: BC Managed Care – PPO | Admitting: Internal Medicine

## 2014-04-16 VITALS — BP 108/80 | HR 88 | Ht 75.0 in | Wt 227.3 lb

## 2014-04-16 DIAGNOSIS — I951 Orthostatic hypotension: Secondary | ICD-10-CM

## 2014-04-16 DIAGNOSIS — Y249XXA Unspecified firearm discharge, undetermined intent, initial encounter: Secondary | ICD-10-CM

## 2014-04-16 DIAGNOSIS — Z951 Presence of aortocoronary bypass graft: Secondary | ICD-10-CM

## 2014-04-16 DIAGNOSIS — R5383 Other fatigue: Secondary | ICD-10-CM | POA: Diagnosis not present

## 2014-04-16 DIAGNOSIS — I4891 Unspecified atrial fibrillation: Secondary | ICD-10-CM

## 2014-04-16 DIAGNOSIS — T148 Other injury of unspecified body region: Secondary | ICD-10-CM
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
## 2014-04-16 NOTE — Patient Instructions (Signed)
Dr Rennis GoldenHilty has made no changes in your current medications or therapy.  Dr Rennis GoldenHilty recommends that you schedule a follow-up appointment in 6 months. You will receive a reminder letter in the mail two months in advance. If you don't receive a letter, please call our office to schedule the follow-up appointment.

## 2014-04-28 ENCOUNTER — Telehealth: Payer: Self-pay | Admitting: *Deleted

## 2014-04-28 NOTE — Telephone Encounter (Signed)
mailed signed order that patient was to hold PT until further notice per cardiologist (V/O date 04/02/14)

## 2014-06-02 ENCOUNTER — Ambulatory Visit: Payer: BC Managed Care – PPO | Admitting: Internal Medicine

## 2014-06-26 ENCOUNTER — Other Ambulatory Visit: Payer: Self-pay | Admitting: Internal Medicine

## 2014-06-26 NOTE — Telephone Encounter (Signed)
Rx has been sent to the pharmacy electronically. ° °

## 2014-08-05 ENCOUNTER — Ambulatory Visit: Payer: Self-pay | Admitting: Pharmacist Clinician (PhC)/ Clinical Pharmacy Specialist

## 2014-08-05 DIAGNOSIS — I4891 Unspecified atrial fibrillation: Secondary | ICD-10-CM

## 2014-09-08 ENCOUNTER — Encounter: Payer: Self-pay | Admitting: Thoracic Surgery (Cardiothoracic Vascular Surgery)

## 2014-09-08 ENCOUNTER — Ambulatory Visit (INDEPENDENT_AMBULATORY_CARE_PROVIDER_SITE_OTHER): Payer: BC Managed Care – PPO | Admitting: Thoracic Surgery (Cardiothoracic Vascular Surgery)

## 2014-09-08 VITALS — BP 122/79 | HR 72 | Resp 20 | Ht 75.0 in | Wt 246.0 lb

## 2014-09-08 DIAGNOSIS — Z951 Presence of aortocoronary bypass graft: Secondary | ICD-10-CM

## 2014-09-08 DIAGNOSIS — I712 Thoracic aortic aneurysm, without rupture: Secondary | ICD-10-CM

## 2014-09-08 DIAGNOSIS — I7121 Aneurysm of the ascending aorta, without rupture: Secondary | ICD-10-CM
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
## 2014-09-08 NOTE — Patient Instructions (Signed)
Continue all previous medications without any changes at this time  Make every effort to stay physically active, get some type of exercise on a regular basis, and stick to a "heart healthy diet".  The long term benefits for regular exercise and a healthy diet are critically important to your overall health and wellbeing.  

## 2015-01-20 ENCOUNTER — Other Ambulatory Visit: Payer: Self-pay | Admitting: Internal Medicine

## 2015-01-21 NOTE — Telephone Encounter (Signed)
Rx request sent to pharmacy.  

## 2015-02-03 ENCOUNTER — Telehealth: Payer: Self-pay | Admitting: Internal Medicine

## 2015-02-03 MED ORDER — ATORVASTATIN CALCIUM 80 MG PO TABS: 80.0000 mg | ORAL_TABLET | Freq: Every day | ORAL | Status: DC

## 2015-02-03 NOTE — Telephone Encounter (Signed)
°*  STAT* If patient is at the pharmacy, call can be transferred to refill team.   1. Which medications need to be refilled? (please list name of each medication and dose if known) Atorvastatin-need enough until hi appt on 02-13-15 please.  2. Which pharmacy/location (including street and city if local pharmacy) is medication to be sent to?[phone]  3. Do they need a 30 day or 90 day supply? 10

## 2015-02-03 NOTE — Telephone Encounter (Signed)
Refill sent to the pharmacy electronically.  

## 2015-02-04 ENCOUNTER — Other Ambulatory Visit: Payer: Self-pay | Admitting: Internal Medicine

## 2015-02-04 NOTE — Telephone Encounter (Signed)
Rx request sent to pharmacy.  

## 2015-02-13 ENCOUNTER — Ambulatory Visit: Payer: BC Managed Care – PPO | Admitting: Internal Medicine

## 2015-02-24 ENCOUNTER — Ambulatory Visit (INDEPENDENT_AMBULATORY_CARE_PROVIDER_SITE_OTHER): Payer: BC Managed Care – PPO | Admitting: Internal Medicine

## 2015-02-24 ENCOUNTER — Encounter: Payer: Self-pay | Admitting: Internal Medicine

## 2015-02-24 VITALS — BP 130/90 | HR 68 | Ht 75.0 in | Wt 247.0 lb

## 2015-02-24 DIAGNOSIS — Z951 Presence of aortocoronary bypass graft: Secondary | ICD-10-CM | POA: Diagnosis not present

## 2015-02-24 DIAGNOSIS — E785 Hyperlipidemia, unspecified: Secondary | ICD-10-CM | POA: Diagnosis not present

## 2015-02-24 DIAGNOSIS — Z7901 Long term (current) use of anticoagulants: Secondary | ICD-10-CM

## 2015-02-24 DIAGNOSIS — I77810 Thoracic aortic ectasia: Secondary | ICD-10-CM

## 2015-02-24 MED ORDER — ATORVASTATIN CALCIUM 80 MG PO TABS: 80.0000 mg | ORAL_TABLET | Freq: Every day | ORAL | Status: DC

## 2015-02-24 NOTE — Patient Instructions (Signed)
Your physician wants you to follow-up in: 6 months with Dr. Hilty. You will receive a reminder letter in the mail two months in advance. If you don't receive a letter, please call our office to schedule the follow-up appointment.  Your physician recommends that you continue on your current medications as directed. Please refer to the Current Medication list given to you today.  
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
## 2015-03-05 ENCOUNTER — Telehealth: Payer: Self-pay | Admitting: Internal Medicine

## 2015-03-05 NOTE — Telephone Encounter (Signed)
Patient was in last week to see Dr. Hilty----[facility] not receive the 90 day supply with 3 refills for his Lipitor.  Pleas call Mrs.Draught when this is taken care of.

## 2015-03-05 NOTE — Telephone Encounter (Signed)
Called pharmacy. Pharmacy personnel verified that prescription was received and that he already picked up medication.  Called patient's wife. She states that they only picked up #15.

## 2015-03-05 NOTE — Telephone Encounter (Signed)
Julaine Fusi, RN called pharmacy again. Per the pharmacy, the patient called in an automatic refill which defaulted to the #15 prescription.   Notified patient's wife when calling next time to refill prescriptions to actually speak to pharmacy personnel to make sure that the correct number of tablets is dispensed.  Wife understood and agreed with plan.

## 2015-03-15 ENCOUNTER — Other Ambulatory Visit: Payer: Self-pay | Admitting: Internal Medicine

## 2015-04-28 ENCOUNTER — Encounter: Payer: Self-pay | Admitting: Nurse Practitioner

## 2015-04-28 ENCOUNTER — Ambulatory Visit (INDEPENDENT_AMBULATORY_CARE_PROVIDER_SITE_OTHER): Payer: BC Managed Care – PPO | Admitting: Nurse Practitioner

## 2015-04-28 VITALS — BP 124/88 | HR 76 | Ht 75.0 in | Wt 243.8 lb

## 2015-04-28 DIAGNOSIS — E118 Type 2 diabetes mellitus with unspecified complications: Secondary | ICD-10-CM | POA: Insufficient documentation

## 2015-04-28 DIAGNOSIS — I119 Hypertensive heart disease without heart failure: Secondary | ICD-10-CM

## 2015-04-28 DIAGNOSIS — I2510 Atherosclerotic heart disease of native coronary artery without angina pectoris: Secondary | ICD-10-CM | POA: Diagnosis not present

## 2015-04-28 DIAGNOSIS — E785 Hyperlipidemia, unspecified: Secondary | ICD-10-CM | POA: Diagnosis not present

## 2015-04-28 MED ORDER — LISINOPRIL 2.5 MG PO TABS: 2.5000 mg | ORAL_TABLET | Freq: Every day | ORAL | Status: DC

## 2015-04-28 NOTE — Progress Notes (Signed)
Office Visit    Patient Name: Frank PernaHenry Shaff Jr. Date of Encounter: 04/28/2015  Primary Care Provider:  Jolene ProvostHAIMES,DAVID M, MD Primary Cardiologist:  C. Hilty, MD   Chief Complaint    65 year old male with a history of coronary artery disease status post CABG who presents for follow-up related to elevated blood pressures.  Past Medical History    Past Medical History  Diagnosis Date  . Obesity (BMI 30.0-34.9)   . CAD (coronary artery disease)     a. 08/2013 Cath: 3 Vessel CAD (despite Neg Myoview): pLAD Ca2++ 90%,mCx (after OM1) 95-99%, dRCA-ostRPDA 95% --> referred for cabg; b. 09/12/2013 CABG x 5 (LIMA->LAD, Seq VG->PDA->RPL, Seq VG->RI->OM1).  . Hypertensive heart disease     a. complicated by orthostatic hypotension following CABG resulting in discontinuation of Beta Blocker.  . Dyslipidemia, goal LDL below 70   . Diabetes mellitus type 2 with complications (HCC)     a. currently diet controlled (4/17).  . Ascending aorta dilatation (HCC)     a. Max reported diameter 4.6 cm by CTA 06/2013; b. 09/2013 s/p banding or proximal ascending thoracic Ao @ time of CABG.  . Postoperative atrial fibrillation (HCC)     a. 09/2013-->briefly maintained on amio and coumadin post-op.  . Arthritis    Past Surgical History  Procedure Laterality Date  . Cardiac catheterization  08/2013    3V CAD - despite Negative Myoview for Unstable Angina -- pLDA 95%, mCx 95-99%, dRCA-ostRPDA 95%  . Nasal septum surgery    . Scrotum exploration    . Coronary artery bypass graft N/A 09/12/2013    Procedure: CORONARY ARTERY BYPASS GRAFTING (CABG), on pump, times five, using left internal mammary artery, right greater saphenous vein hervested endoscopically.;  Surgeon: Purcell Nailslarence H Owen, MD;  Location: [facility] OR;  Service: Open Heart Surgery;  Laterality: N/A;  LIMA-LAD; SEQ SVG-INTERM.-OM1; SEQ SVG-PD-PL   . Intraoperative transesophageal echocardiogram N/A 09/12/2013    Procedure: INTRAOPERATIVE TRANSESOPHAGEAL  ECHOCARDIOGRAM;  Surgeon: Purcell Nailslarence H Owen, MD;  [facility];  Service: Open Heart Surgery;  Laterality: N/A;  . Left heart catheterization with coronary angiogram N/A 09/11/2013    Procedure: LEFT HEART CATHETERIZATION WITH CORONARY ANGIOGRAM;  Surgeon: Chrystie NoseKenneth C. Hilty, MD;  [facility];  Service: Cardiovascular;  Laterality: N/A;    Allergies  Allergies  Allergen Reactions  . Ceclor [Cefaclor] Rash  . Penicillins Rash    History of Present Illness    65 year old male with a history of coronary artery disease status post coronary artery bypass grafting 5 in September 2015, postoperative atrial fibrillation, hypertension, hyperlipidemia, diabetes, obesity, and dilated thoracic aorta status post banding at the time of his bypass surgery. Postoperative course was complicated by cellulitis and hematoma of the right leg requiring oral antibiotics, along with mild volume overload. He later developed orthostatic hypotension requiring discontinuation of beta blocker therapy in the spring of 2016.  Patient was last seen in clinic in February of this year, at which time he was doing well. It was noted that he could follow-up in 6 months. Mr. Frank Moreno checks his blood pressure daily at home and over the past month or so, has been noting systolic blood pressures typically in the 140s and 150s with diastolics running in the high 80s to low 100s. Last week, his diastolic is 105 on more than one day and his wife recommended that he arrange for follow-up. He has not been having any chest pain, dyspnea, PND, orthopnea, dizziness, syncope, edema, or  early satiety. He has not had any sort of orthostatic symptoms in over a year. He has not noted any changes in his diet to account for rising blood pressure.  Home Medications    Prior to Admission medications   Medication Sig Start Date End Date Taking? Authorizing Provider  acetaminophen (TYLENOL ARTHRITIS PAIN) 650 MG CR tablet Take 650 mg by mouth  every 8 (eight) hours as needed for pain.   Yes Historical Provider, MD  allopurinol (ZYLOPRIM) 300 MG tablet Take 300 mg by mouth daily.   Yes Historical Provider, MD  aspirin 81 MG tablet Take 81 mg by mouth daily.   Yes Historical Provider, MD  atorvastatin (LIPITOR) 80 MG tablet Take 1 tablet (80 mg total) by mouth daily at 6 PM. 02/24/15  Yes Chrystie Nose, MD  celecoxib (CELEBREX) 200 MG capsule Take 200 mg by mouth daily. 03/14/14  Yes Historical Provider, MD  Cholecalciferol (VITAMIN D-3) 1000 UNITS CAPS Take 2,000 Units by mouth 3 (three) times daily.    Yes Historical Provider, MD  HYDROcodone-acetaminophen (NORCO/VICODIN) 5-325 MG tablet Take 1 tablet by mouth every 6 (six) hours as needed for moderate pain.   Yes Historical Provider, MD  levothyroxine (SYNTHROID, LEVOTHROID) 75 MCG tablet Take 75 mcg by mouth daily before breakfast.   Yes Historical Provider, MD  Magnesium ([employer] TRIPLE MAGNESIUM COMPLEX) 400 MG CAPS Take 400 mg by mouth daily.   Yes Historical Provider, MD  pyridOXINE (VITAMIN B-6) 100 MG tablet Take 100 mg by mouth 3 (three) times daily.   Yes Historical Provider, MD  tiZANidine (ZANAFLEX) 4 MG tablet Take 2 mg by mouth 2 (two) times daily as needed for muscle spasms.  05/09/13  Yes Historical Provider, MD  vitamin C (ASCORBIC ACID) 250 MG tablet Take 500 mg by mouth daily.   Yes Historical Provider, MD  lisinopril (PRINIVIL,ZESTRIL) 2.5 MG tablet Take 1 tablet (2.5 mg total) by mouth daily. 04/28/15   Ok Anis, NP    Review of Systems    As above, patient has been stable from a symptom standpoint however his blood pressure has been running up.  He denies chest pain, palpitations, dyspnea, pnd, orthopnea, n, v, dizziness, syncope, edema, weight gain, or early satiety.  All other systems reviewed and are otherwise negative except as noted above.  Physical Exam    VS:  BP 124/88 mmHg  Pulse 76  Ht  (1.905 m)  Wt 243 lb 12.8 oz (110.587 kg)  BMI 30.47  kg/m2 , BMI Body mass index is 30.47 kg/(m^2). GEN: Well nourished, well developed, in no acute distress. HEENT: normal. Neck: Supple, no JVD, carotid bruits, or masses. Cardiac: RRR, no murmurs, rubs, or gallops. No clubbing, cyanosis, edema.  Radials/DP/PT 2+ and equal bilaterally.  Respiratory:  Respirations regular and unlabored, clear to auscultation bilaterally. GI: Soft, nontender, nondistended, BS + x 4. MS: no deformity or atrophy. Skin: warm and dry, no rash. Neuro:  Strength and sensation are intact. Psych: Normal affect.  Accessory Clinical Findings    ECG - regular sinus rhythm, 69, no acute ST or T changes.  Assessment & Plan    1.  Hypertensive heart disease: Patient presented today for evaluation secondary to elevated blood pressures noted at home, saying that he has been trending in the 140s to 150s over predominantly 90s to low 100s over the past few weeks to month. Interestingly, his blood pressures 124/88 in the clinic, however he says that that is likely an  outlier. I reviewed his past medical history. He does have a history of what is now diet controlled diabetes. In that setting, I will add lisinopril 2.5 mg daily. He knows to be on the look out for orthostatic symptoms. I will arrange for a follow-up basic metabolic panel in 1 week.  2. Coronary artery disease status post coronary artery bypass grafting: Patient has been doing well without chest pain or dyspnea. He remains on aspirin, statin therapy.  3. Hyperlipidemia:patient remains on high potency statin therapy and had an LDL of 42 in September 2015. LFTs were normal one year ago.  4. Diet-controlled diabetes mellitus: Patient says his fasting sugars are generally right around the 100. This is followed by primary care. He is on statin and as above, I am adding low-dose ACE inhibitor in the setting of elevated blood pressures at home.  5. Disposition: follow-up basic metabolic panel in 1 week given new ACE  inhibitor therapy.  Follow-up with Dr. Rennis Golden in 4 months as scheduled.  Nicolasa Ducking, NP 04/28/2015, 4:28 PM

## 2015-04-28 NOTE — Patient Instructions (Signed)
Medication Instructions: Ward Givenshris Berge, NP, has recommended making the following medication changes: 1. START Lisinopril 2.5 mg - take 1 tablet by mouth daily  Labwork: Your physician recommends that you return for lab work in 1 week.  Testing/Procedures: NONE  Follow-up: Your physician recommends that you follow-up with Dr Rennis GoldenHilty as recommended at last office visit.  If you need a refill on your cardiac medications before your next appointment, please call your pharmacy.

## 2015-05-12 LAB — BASIC METABOLIC PANEL
BUN: 19 mg/dL (ref 7–25)
CO2: 23 mmol/L (ref 20–31)
Calcium: 9.1 mg/dL (ref 8.6–10.3)
Chloride: 102 mmol/L (ref 98–110)
Creat: 0.95 mg/dL (ref 0.70–1.25)
Glucose, Bld: 123 mg/dL — ABNORMAL HIGH (ref 65–99)
Potassium: 4.4 mmol/L (ref 3.5–5.3)
Sodium: 137 mmol/L (ref 135–146)

## 2015-08-27 ENCOUNTER — Encounter: Payer: Self-pay | Admitting: Internal Medicine

## 2015-08-27 ENCOUNTER — Ambulatory Visit (INDEPENDENT_AMBULATORY_CARE_PROVIDER_SITE_OTHER): Payer: BC Managed Care – PPO | Admitting: Internal Medicine

## 2015-08-27 VITALS — BP 122/85 | HR 65 | Ht 75.0 in | Wt 249.8 lb

## 2015-08-27 DIAGNOSIS — Z01812 Encounter for preprocedural laboratory examination: Secondary | ICD-10-CM | POA: Diagnosis not present

## 2015-08-27 DIAGNOSIS — I77810 Thoracic aortic ectasia: Secondary | ICD-10-CM | POA: Diagnosis not present

## 2015-08-27 DIAGNOSIS — E785 Hyperlipidemia, unspecified: Secondary | ICD-10-CM

## 2015-08-27 DIAGNOSIS — Z951 Presence of aortocoronary bypass graft: Secondary | ICD-10-CM

## 2015-08-27 LAB — BUN: BUN: 21 mg/dL (ref 7–25)

## 2015-08-27 LAB — CREATININE, SERUM: Creat: 1.06 mg/dL (ref 0.70–1.25)

## 2015-08-27 NOTE — Patient Instructions (Signed)
Dr. Rennis GoldenHilty has ordered a CT to evaluate your aortic aneurysm  [address]  Your physician wants you to follow-up in: ONE YEAR with Dr. Rennis GoldenHilty. You will receive a reminder letter in the mail two months in advance. If you don't receive a letter, please call our office to schedule the follow-up appointment.
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
## 2015-09-03 ENCOUNTER — Ambulatory Visit (INDEPENDENT_AMBULATORY_CARE_PROVIDER_SITE_OTHER)
Admission: RE | Admit: 2015-09-03 | Discharge: 2015-09-03 | Disposition: A | Discharge status: Home / Self Care | Payer: BC Managed Care – PPO | Source: Ambulatory Visit | Attending: Internal Medicine | Admitting: Internal Medicine

## 2015-09-03 ENCOUNTER — Other Ambulatory Visit: Payer: Self-pay | Admitting: *Deleted

## 2015-09-03 ENCOUNTER — Inpatient Hospital Stay: Admission: RE | Admit: 2015-09-03 | Payer: BC Managed Care – PPO | Source: Ambulatory Visit

## 2015-09-03 DIAGNOSIS — I77810 Thoracic aortic ectasia: Secondary | ICD-10-CM | POA: Diagnosis not present

## 2015-09-24 IMAGING — CT CT ANGIO CHEST
1 of 8 series · 1 of 36 positions shown · IV contrast (Iohexol (Omnipaque 350))
Comparison: Chest x-ray 07/29/2013.  CT chest 06/27/2013.

CLINICAL DATA: Thoracic aneurysm. Evaluate for aortic dissection.
Coronary artery disease.

EXAM:
CT ANGIOGRAPHY CHEST WITH CONTRAST
TECHNIQUE: Multidetector CT imaging of the chest was performed using the
standard protocol during bolus administration of intravenous
contrast. Multiplanar CT image reconstructions and MIPs were
obtained to evaluate the vascular anatomy.
CONTRAST:  100mL OMNIPAQUE IOHEXOL 350 MG/ML SOLN

[Series 300: locator · axial · 0.68mm/px · 1 of 1 slices shown]
[im 1/1  lung]
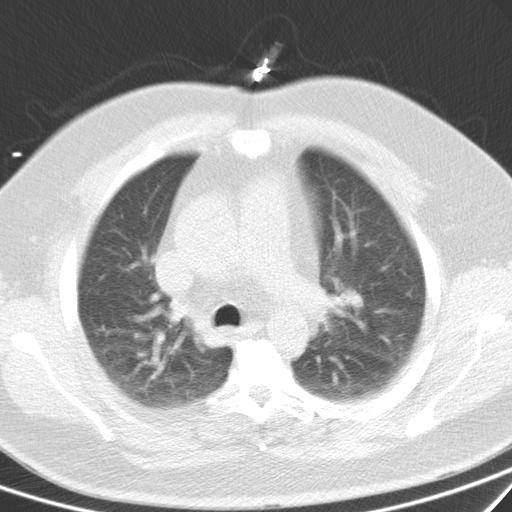

[1 of 36 positions shown; findings below may reference images not displayed]

FINDINGS: Thoracic aorta appears stable. Dilatation of the ascending aorta
cm again noted. No dissection. Pulmonary arteries are normal.Stable
cardiomegaly. Coronary artery disease.

No significant mediastinal adenopathy noted. Thoracic esophagus is
unremarkable.

Large airways are patent. Basilar atelectasis and tiny bilateral
pleural effusion. Previously identified subpleural nodular densities
are less well identified due to the basilar atelectasis present and
tiny pleural effusions present.

Adrenals are normal. Simple cyst left kidney. Visualized upper
abdominal structures otherwise unremarkable.

Degenerative changes lumbar spine. Punctate stable density lower
vertebral body most likely bone island .

Review of the MIP images confirms the above findings.
IMPRESSION: 1. Stable dilatation of the ascending aorta to 4.6 cm.
2. No evidence of aortic dissection.
3. Stable cardiomegaly.  Coronary artery disease.
4. Mild bibasilar atelectasis. Tiny bilateral effusions. Previously
identified subpleural nodular densities are not well identified due
to the basilar atelectasis and tiny pleural effusion present .

## 2016-04-14 ENCOUNTER — Other Ambulatory Visit: Payer: Self-pay | Admitting: Nurse Practitioner

## 2016-04-14 NOTE — Telephone Encounter (Signed)
REFILL 

## 2016-08-12 ENCOUNTER — Other Ambulatory Visit: Payer: Self-pay | Admitting: Nurse Practitioner

## 2016-09-02 ENCOUNTER — Ambulatory Visit: Payer: BC Managed Care – PPO | Admitting: Internal Medicine

## 2016-09-02 ENCOUNTER — Ambulatory Visit (INDEPENDENT_AMBULATORY_CARE_PROVIDER_SITE_OTHER): Payer: Medicare Other | Admitting: Internal Medicine

## 2016-09-02 ENCOUNTER — Encounter: Payer: Self-pay | Admitting: Internal Medicine

## 2016-09-02 VITALS — BP 116/80 | HR 62 | Ht 75.0 in | Wt 258.0 lb

## 2016-09-02 DIAGNOSIS — E118 Type 2 diabetes mellitus with unspecified complications: Secondary | ICD-10-CM | POA: Diagnosis not present

## 2016-09-02 DIAGNOSIS — E785 Hyperlipidemia, unspecified: Secondary | ICD-10-CM | POA: Diagnosis not present

## 2016-09-02 DIAGNOSIS — Z951 Presence of aortocoronary bypass graft: Secondary | ICD-10-CM | POA: Diagnosis not present

## 2016-09-02 DIAGNOSIS — I77810 Thoracic aortic ectasia: Secondary | ICD-10-CM

## 2016-09-02 MED ORDER — ATORVASTATIN CALCIUM 40 MG PO TABS: 40.0000 mg | ORAL_TABLET | Freq: Every day | ORAL | 3 refills | Status: DC
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
## 2016-09-02 NOTE — Patient Instructions (Addendum)
Dr. Rennis Golden has recommended you make the following change in your medication:  -- START atorvastatin 40mg  once daily at night  Your physician recommends that you return for lab work in THREE MONTHS (fasting)  Your physician wants you to follow-up in: ONE YEAR with Dr. Rennis Golden. You will receive a reminder letter in the mail two months in advance. If you don't receive a letter, please call our office to schedule the follow-up appointment.

## 2016-11-14 ENCOUNTER — Other Ambulatory Visit: Payer: Self-pay | Admitting: *Deleted

## 2016-11-14 MED ORDER — LISINOPRIL 2.5 MG PO TABS: 2.5000 mg | ORAL_TABLET | Freq: Every day | ORAL | 3 refills | Status: DC

## 2017-08-22 ENCOUNTER — Other Ambulatory Visit: Payer: Self-pay | Admitting: Internal Medicine

## 2017-08-28 ENCOUNTER — Telehealth: Payer: Self-pay | Admitting: Internal Medicine

## 2017-08-28 DIAGNOSIS — I712 Thoracic aortic aneurysm, without rupture, unspecified: Secondary | ICD-10-CM

## 2017-08-28 NOTE — Telephone Encounter (Signed)
Spoke with wife who states they prefer for patient to have CT angio chest aorta [facility] [place] as he already has testing there and it will be cheaper for him. Contacted this imaging location and received their fax # to send over order. Pre-cert notified via staff message to authorize test.   Faxed printed order requisition to [phone]

## 2017-08-28 NOTE — Telephone Encounter (Signed)
New Message   Pt's wife is calling to see if the pt needs a ct scan to check his aneurysm because he is already having a ct this Thursday on his neck and they would like to have both done at the same time if possible. Please call

## 2017-08-28 NOTE — Telephone Encounter (Signed)
Returned call to wife-states patient is having CT of his [facility] on [facility] [place] and was hoping to have CT for his aneurysm at the same time.  Advised he does seem to be due for scan but this would all depend on scheduling and capability of imaging location.   Routed to primary nurse for further review.

## 2017-09-06 ENCOUNTER — Encounter: Payer: Self-pay | Admitting: Internal Medicine

## 2017-09-06 ENCOUNTER — Ambulatory Visit: Payer: Medicare Other | Admitting: Internal Medicine

## 2017-09-06 VITALS — BP 122/60 | HR 47 | Ht 75.0 in | Wt 246.0 lb

## 2017-09-06 DIAGNOSIS — Z951 Presence of aortocoronary bypass graft: Secondary | ICD-10-CM

## 2017-09-06 DIAGNOSIS — K760 Fatty (change of) liver, not elsewhere classified: Secondary | ICD-10-CM

## 2017-09-06 DIAGNOSIS — R748 Abnormal levels of other serum enzymes: Secondary | ICD-10-CM

## 2017-09-06 DIAGNOSIS — I712 Thoracic aortic aneurysm, without rupture, unspecified: Secondary | ICD-10-CM

## 2017-09-06 DIAGNOSIS — E785 Hyperlipidemia, unspecified: Secondary | ICD-10-CM
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
## 2017-09-06 NOTE — Patient Instructions (Signed)
Your physician wants you to follow-up in: ONE YEAR with Dr. Hilty. You will receive a reminder letter in the mail two months in advance. If you don't receive a letter, please call our office to schedule the follow-up appointment.  

## 2017-09-07 ENCOUNTER — Encounter: Payer: Self-pay | Admitting: Internal Medicine

## 2017-09-07 DIAGNOSIS — R748 Abnormal levels of other serum enzymes: Secondary | ICD-10-CM | POA: Insufficient documentation

## 2017-09-07 DIAGNOSIS — I712 Thoracic aortic aneurysm, without rupture, unspecified: Secondary | ICD-10-CM | POA: Insufficient documentation

## 2017-09-07 DIAGNOSIS — K760 Fatty (change of) liver, not elsewhere classified: Secondary | ICD-10-CM | POA: Insufficient documentation

## 2017-10-16 ENCOUNTER — Telehealth: Payer: Self-pay | Admitting: Internal Medicine

## 2017-10-16 NOTE — Telephone Encounter (Signed)
Called patient's wife about new diagnosis of ALS. She reports patient has been using mustard for hand cramping for a few days and plans to have him see if this helps symptoms. If not, she is interested in mexiletine for symptoms if mustard does not help. Explained that patient would need an EKG check done 1-2 weeks after starting medication d/t how it can affect electrical conduction in heart. She voiced understanding. She was under impression Dr. Rennis Golden would prescribed this medication, instead of neurologist.   Will route to MD for f/up

## 2017-10-16 NOTE — Telephone Encounter (Signed)
Called patient to discuss mexilitine.  Dr. Rexene Edison

## 2017-11-06 ENCOUNTER — Other Ambulatory Visit: Payer: Self-pay | Admitting: Internal Medicine

## 2017-11-24 ENCOUNTER — Other Ambulatory Visit: Payer: Self-pay | Admitting: Internal Medicine

## 2018-02-15 ENCOUNTER — Other Ambulatory Visit: Payer: Self-pay | Admitting: Internal Medicine

## 2018-06-11 ENCOUNTER — Other Ambulatory Visit: Payer: Self-pay

## 2018-06-11 MED ORDER — LISINOPRIL 2.5 MG PO TABS: 2.5000 mg | ORAL_TABLET | Freq: Every day | ORAL | 0 refills | Status: DC

## 2018-06-11 MED ORDER — ATORVASTATIN CALCIUM 40 MG PO TABS: 40.0000 mg | ORAL_TABLET | Freq: Every day | ORAL | 0 refills | Status: AC

## 2018-06-11 NOTE — Telephone Encounter (Signed)
Rx(s) sent to pharmacy electronically.  

## 2018-08-06 ENCOUNTER — Telehealth (INDEPENDENT_AMBULATORY_CARE_PROVIDER_SITE_OTHER): Payer: Medicare Other | Admitting: Internal Medicine

## 2018-08-06 ENCOUNTER — Telehealth: Payer: Self-pay | Admitting: Internal Medicine

## 2018-08-06 ENCOUNTER — Encounter: Payer: Self-pay | Admitting: Internal Medicine

## 2018-08-06 VITALS — Ht 75.0 in | Wt 270.0 lb

## 2018-08-06 DIAGNOSIS — G1221 Amyotrophic lateral sclerosis: Secondary | ICD-10-CM | POA: Diagnosis not present

## 2018-08-06 DIAGNOSIS — Z951 Presence of aortocoronary bypass graft: Secondary | ICD-10-CM

## 2018-08-06 DIAGNOSIS — I712 Thoracic aortic aneurysm, without rupture, unspecified: Secondary | ICD-10-CM

## 2018-08-06 DIAGNOSIS — E785 Hyperlipidemia, unspecified: Secondary | ICD-10-CM

## 2018-08-06 DIAGNOSIS — I480 Paroxysmal atrial fibrillation: Secondary | ICD-10-CM | POA: Diagnosis not present

## 2018-08-06 DIAGNOSIS — I77810 Thoracic aortic ectasia: Secondary | ICD-10-CM

## 2018-08-06 NOTE — Patient Instructions (Signed)
Medication Instructions:  Your physician recommends that you continue on your current medications as directed. Please refer to the Current Medication list given to you today.  If you need a refill on your cardiac medications before your next appointment, please call your pharmacy.   Lab work: None ordered If you have labs (blood work) drawn today and your tests are completely normal, you will receive your results only by: [place] (if you have MyChart) OR A paper copy in the mail If you have any lab test that is abnormal or we need to change your treatment, we will call you to review the results.  Testing/Procedures: None ordered  Follow-Up: Follow up as needed with Dr. Debara Pickett

## 2018-08-06 NOTE — Telephone Encounter (Signed)
° ° °Virtual Visit Pre-Appointment Phone Call ° °"(Name), I am calling you today to discuss your upcoming appointment. We are currently trying to limit exposure to the virus that causes COVID-19 by seeing patients at home rather than in the office." ° °1. Confrm consent - "In the setting of the current Covid19 crisis, you are scheduled for a (phone or video) visit with your provider on (date) at (time).  Just as we do with many in-office visits, in order for you to participate in this visit, we must obtain consent.  If you'd like, I can send this to your mychart (if signed up) or email for you to review.  Otherwise, I can obtain your verbal consent now.  All virtual visits are billed to your insurance company just like a normal visit would be.  By agreeing to a virtual visit, we'd like you to understand that the technology does not allow for your provider to perform an examination, and thus may limit your provider's ability to fully assess your condition. If your provider identifies any concerns that need to be evaluated in person, we will make arrangements to do so.  Finally, though the technology is pretty good, we cannot assure that it will always work on either your or our end, and in the setting of a video visit, we may have to convert it to a phone-only visit.  In either situation, we cannot ensure that we have a secure connection.  Are you willing to proceed?" STAFF: Did the patient verbally acknowledge consent to telehealth visit? Document YES/NO here: Yes ° ° ° ° ° ° °FULL LENGTH CONSENT FOR TELE-HEALTH VISIT  ° °I hereby voluntarily request, consent and authorize [facility] and its employed or contracted physicians, physician assistants, nurse practitioners or other licensed health care professionals (the Practitioner), to provide me with telemedicine health care services (the “Services") as deemed necessary by the treating Practitioner. I acknowledge and consent to receive the Services by the  Practitioner via telemedicine. I understand that the telemedicine visit will involve communicating with the Practitioner through live audiovisual communication technology and the disclosure of certain medical information by electronic transmission. I acknowledge that I have been given the opportunity to request an in-person assessment or other available alternative prior to the telemedicine visit and am voluntarily participating in the telemedicine visit. ° °I understand that I have the right to withhold or withdraw my consent to the use of telemedicine in the course of my care at any time, without affecting my right to future care or treatment, and that the Practitioner or I may terminate the telemedicine visit at any time. I understand that I have the right to inspect all information obtained and/or recorded in the course of the telemedicine visit and may receive copies of available information for a reasonable fee.  I understand that some of the potential risks of receiving the Services via telemedicine include:  °• Delay or interruption in medical evaluation due to technological equipment failure or disruption; °• Information transmitted may not be sufficient (e.g. poor resolution of images) to allow for appropriate medical decision making by the Practitioner; and/or  °• In rare instances, security protocols could fail, causing a breach of personal health information. ° °Furthermore, I acknowledge that it is my responsibility to provide information about my medical history, conditions and care that is complete and accurate to the best of my ability. I acknowledge that Practitioner's advice, recommendations, and/or decision may be based on factors not within their control, such   as incomplete or inaccurate data provided by me or distortions of diagnostic images or specimens that may result from electronic transmissions. I understand that the practice of medicine is not an exact science and that Practitioner makes  no warranties or guarantees regarding treatment outcomes. I acknowledge that I will receive a copy of this consent concurrently upon execution via email to the email address I last provided but may also request a printed copy by calling the office of [facility].   ° °I understand that my insurance will be billed for this visit.  ° °I have read or had this consent read to me. °• I understand the contents of this consent, which adequately explains the benefits and risks of the Services being provided via telemedicine.  °• I have been provided ample opportunity to ask questions regarding this consent and the Services and have had my questions answered to my satisfaction. °• I give my informed consent for the services to be provided through the use of telemedicine in my medical care ° °By participating in this telemedicine visit I agree to the above. ° °

## 2018-08-06 NOTE — Progress Notes (Signed)
Virtual Visit via Telephone Note   This visit type was conducted due to national recommendations for restrictions regarding the COVID-19 Pandemic (e.g. social distancing) in an effort to limit this patient's exposure and mitigate transmission in our community.  Due to his co-morbid illnesses, this patient is at least at moderate risk for complications without adequate follow up.  This format is felt to be most appropriate for this patient at this time.  The patient did not have access to video technology/had technical difficulties with video requiring transitioning to audio format only (telephone).  All issues noted in this document were discussed and addressed.  No physical exam could be performed with this format.  Please refer to the patient's chart for his  consent to telehealth for [facility].   Evaluation Performed:  Telephone follow-up  Date:  08/06/2018   ID:  Frank Cole., DOB 1950/02/23, MRN [id number]  Patient Location:  [address] [place] [address]  Provider location:   [address] [employer] [address] [place], [place] [address]  PCP:  Jonathon Bellows, PA-C  Cardiologist:  No primary care provider on file. Electrophysiologist:  None   Chief Complaint:  "I have ALS"  History of Present Illness:    Frank Rundle. is a 68 y.o. male who presents via audio/video conferencing for a telehealth visit today.  Frank Moreno was seen today for follow-up via telephone visit.  Unfortunately he has been diagnosed with ALS.  This case is been fairly rapidly progressive.  He has been followed at [facility] and tried to enroll in some clinical trials without success.  He is now lost ability to use his arms and legs to great extent and is having difficulty breathing.  He was seen recently according to the notes at [facility] and is now being arranged for palliative care and hospice.  From a cardiac standpoint he said no issues after bypass surgery in 2015.  We did  discuss his statin and the fact that it might accelerate the myopathy related to ALS and I would recommend he discontinue it although it may be inconsequential at this point.  The patient does not have symptoms concerning for COVID-19 infection (fever, chills, cough, or new SHORTNESS OF BREATH).    Prior CV studies:   The following studies were reviewed today:  Chart reviewed including care everywhere notes from [facility]  PMHx:  Past Medical History:  Diagnosis Date  . Arthritis   . Ascending aorta dilatation (HCC)    a. Max reported diameter 4.6 cm by CTA 06/2013; b. 09/2013 s/p banding or proximal ascending thoracic Ao @ time of CABG.  . CAD (coronary artery disease)    a. 1999 Cath: 50% LCX->Med rx; b. 08/2013 Cath: 3 Vessel CAD (despite Neg Myoview): pLAD Ca2++ 90%,mCx (after OM1) 95-99%, dRCA-ostRPDA 95% --> referred for cabg; c. 09/12/2013 CABG x 5 (LIMA->LAD, Seq VG->PDA->RPL, Seq VG->RI->OM1).  . Diabetes mellitus type 2 with complications ([place])    a. currently diet controlled (4/17).  Marland Kitchen Dyslipidemia, goal LDL below 70   . Hypertensive heart disease    a. complicated by orthostatic hypotension following CABG resulting in discontinuation of Beta Blocker.  . Obesity (BMI 30.0-34.9)   . Postoperative atrial fibrillation (HCC)    a. 09/2013-->briefly maintained on amio and coumadin post-op.    Past Surgical History:  Procedure Laterality Date  . CARDIAC CATHETERIZATION  08/2013   3V CAD - despite Negative Myoview for Unstable Angina -- pLDA 95%, mCx 95-99%,  dRCA-ostRPDA 95%  . CORONARY ARTERY BYPASS GRAFT N/A 09/12/2013   Procedure: CORONARY ARTERY BYPASS GRAFTING (CABG), on pump, times five, using left internal mammary artery, right greater saphenous vein hervested endoscopically.;  Surgeon: Purcell Nailslarence H Owen, MD;  Location: [facility] OR;  Service: Open Heart Surgery;  Laterality: N/A;  LIMA-LAD; SEQ SVG-INTERM.-OM1; SEQ SVG-PD-PL   . INTRAOPERATIVE TRANSESOPHAGEAL ECHOCARDIOGRAM N/A  09/12/2013   Procedure: INTRAOPERATIVE TRANSESOPHAGEAL ECHOCARDIOGRAM;  Surgeon: Purcell Nailslarence H Owen, MD;  [facility];  Service: Open Heart Surgery;  Laterality: N/A;  . LEFT HEART CATHETERIZATION WITH CORONARY ANGIOGRAM N/A 09/11/2013   Procedure: LEFT HEART CATHETERIZATION WITH CORONARY ANGIOGRAM;  Surgeon: Chrystie NoseKenneth C. Hilty, MD;  [facility];  Service: Cardiovascular;  Laterality: N/A;  . NASAL SEPTUM SURGERY    . SCROTUM EXPLORATION      FAMHx:  Family History  Problem Relation Age of Onset  . Heart disease Mother   . Heart disease Maternal Grandfather     SOCHx:   reports that he has never smoked. He has never used smokeless tobacco. He reports that he does not drink alcohol or use drugs.  ALLERGIES:  Allergies  Allergen Reactions  . Ceclor [Cefaclor] Rash  . Penicillins Rash    MEDS:  Current Meds  Medication Sig  . acetaminophen (TYLENOL ARTHRITIS PAIN) 650 MG CR tablet Take 650 mg by mouth every 8 (eight) hours as needed for pain.  Marland Kitchen. allopurinol (ZYLOPRIM) 300 MG tablet Take 300 mg by mouth daily.  Marland Kitchen. aspirin 81 MG tablet Take 81 mg by mouth daily.  Marland Kitchen. atorvastatin (LIPITOR) 40 MG tablet Take 1 tablet (40 mg total) by mouth daily.  . celecoxib (CELEBREX) 200 MG capsule Take 200 mg by mouth daily.  . Cholecalciferol (VITAMIN D-3) 1000 UNITS CAPS Take 2,000 Units by mouth 3 (three) times daily.   Marland Kitchen. HYDROcodone-acetaminophen (NORCO/VICODIN) 5-325 MG tablet Take 1 tablet by mouth every 6 (six) hours as needed for moderate pain.  Marland Kitchen. levothyroxine (SYNTHROID, LEVOTHROID) 75 MCG tablet Take 75 mcg by mouth daily before breakfast.  . lisinopril (ZESTRIL) 2.5 MG tablet Take 1 tablet (2.5 mg total) by mouth daily.  . NON FORMULARY Take by mouth daily. Thorne Choleast  . tiZANidine (ZANAFLEX) 4 MG tablet Take 2 mg by mouth 2 (two) times daily as needed for muscle spasms.      ROS: Pertinent items noted in HPI and remainder of comprehensive ROS otherwise negative.   Labs/Other Tests and Data Reviewed:    Recent Labs: No results found for requested labs within last 8760 hours.   Recent Lipid Panel Lab Results  Component Value Date/Time   CHOL 108 09/12/2013 04:01 AM   CHOL 114 08/20/2013 08:42 AM   TRIG 181 (H) 09/12/2013 04:01 AM   TRIG 183 (H) 08/20/2013 08:42 AM   HDL 30 (L) 09/12/2013 04:01 AM   HDL 32 (L) 08/20/2013 08:42 AM   CHOLHDL 3.6 09/12/2013 04:01 AM   LDLCALC 42 09/12/2013 04:01 AM   LDLCALC 45 08/20/2013 08:42 AM    Wt Readings from Last 3 Encounters:  08/06/18 270 lb (122.5 kg)  09/06/17 246 lb (111.6 kg)  09/02/16 258 lb (117 kg)     Exam:    Vital Signs:  Ht 6\' 3"  (1.905 m)   Wt 270 lb (122.5 kg)   BMI 33.75 kg/m    Exam not performed due to telephone visit  ASSESSMENT & PLAN:    1. Coronary artery disease status post CABG 2015 2. Thoracic aortic  aneurysm 3. Dyslipidemia 4. ALS with rapid progression  Unfortunately, Frank Moreno was diagnosed with ALS and has had a rapid progression.  He is now had a prognosis of less than a year to live.  Otherwise he was doing quite well before developing this neuromuscular disorder.  We are certainly available to help as needed however there really is much less to do at this point.  I recommend discontinuing the statin but it is unlikely to change the outcome.  Follow-up with me as needed.  COVID-19 Education: The signs and symptoms of COVID-19 were discussed with the patient and how to seek care for testing (follow up with PCP or arrange E-visit).  The importance of social distancing was discussed today.  Patient Risk:   After full review of this patients clinical status, I feel that they are at least moderate risk at this time.  Time:   Today, I have spent 25 minutes with the patient with telehealth technology discussing ALS, coronary artery disease, thoracic aortic aneurysm and dyslipidemia.     Medication Adjustments/Labs and Tests Ordered: Current medicines are  reviewed at length with the patient today.  Concerns regarding medicines are outlined above.   Tests Ordered: No orders of the defined types were placed in this encounter.   Medication Changes: No orders of the defined types were placed in this encounter.   Disposition:  prn  Chrystie NoseKenneth C. Hilty, MD, Milagros LollFACC, FACP  [place]  [facility]  Medical Director of the [facility] Lipid [facility]  Cardiovascular Risk Reduction [facility] Diplomate of the American Board of Clinical Lipidology Attending Cardiologist  Direct Dial: [phone]  Fax: [phone]  Website:  [email or web address]  Chrystie NoseKenneth C Hilty, MD  08/06/2018 3:54 PM

## 2018-10-23 ENCOUNTER — Other Ambulatory Visit: Payer: Self-pay | Admitting: Internal Medicine

## 2019-02-11 DEATH — deceased
